# Patient Record
Sex: Female | Born: 1945 | Race: White | Hispanic: No | Marital: Married | Smoking: Current every day smoker
Health system: Southern US, Community
[De-identification: ages and names within clinical notes are randomized; demographics above are authoritative.]

## PROBLEM LIST (undated history)

## (undated) DIAGNOSIS — R110 Nausea: Secondary | ICD-10-CM

## (undated) DIAGNOSIS — M797 Fibromyalgia: Secondary | ICD-10-CM

## (undated) DIAGNOSIS — I10 Essential (primary) hypertension: Secondary | ICD-10-CM

## (undated) DIAGNOSIS — F419 Anxiety disorder, unspecified: Secondary | ICD-10-CM

## (undated) DIAGNOSIS — T8859XA Other complications of anesthesia, initial encounter: Secondary | ICD-10-CM

## (undated) DIAGNOSIS — M1990 Unspecified osteoarthritis, unspecified site: Secondary | ICD-10-CM

## (undated) DIAGNOSIS — J45909 Unspecified asthma, uncomplicated: Secondary | ICD-10-CM

## (undated) DIAGNOSIS — F329 Major depressive disorder, single episode, unspecified: Secondary | ICD-10-CM

## (undated) DIAGNOSIS — F32A Depression, unspecified: Secondary | ICD-10-CM

## (undated) DIAGNOSIS — R002 Palpitations: Secondary | ICD-10-CM

## (undated) DIAGNOSIS — E785 Hyperlipidemia, unspecified: Secondary | ICD-10-CM

## (undated) DIAGNOSIS — M51369 Other intervertebral disc degeneration, lumbar region without mention of lumbar back pain or lower extremity pain: Secondary | ICD-10-CM

## (undated) DIAGNOSIS — Z9289 Personal history of other medical treatment: Secondary | ICD-10-CM

## (undated) DIAGNOSIS — K2210 Ulcer of esophagus without bleeding: Secondary | ICD-10-CM

## (undated) DIAGNOSIS — R079 Chest pain, unspecified: Secondary | ICD-10-CM

## (undated) DIAGNOSIS — Z8719 Personal history of other diseases of the digestive system: Secondary | ICD-10-CM

## (undated) DIAGNOSIS — M6530 Trigger finger, unspecified finger: Secondary | ICD-10-CM

## (undated) DIAGNOSIS — I82409 Acute embolism and thrombosis of unspecified deep veins of unspecified lower extremity: Secondary | ICD-10-CM

## (undated) DIAGNOSIS — J449 Chronic obstructive pulmonary disease, unspecified: Secondary | ICD-10-CM

## (undated) DIAGNOSIS — M5136 Other intervertebral disc degeneration, lumbar region: Secondary | ICD-10-CM

## (undated) DIAGNOSIS — T4145XA Adverse effect of unspecified anesthetic, initial encounter: Secondary | ICD-10-CM

## (undated) DIAGNOSIS — K219 Gastro-esophageal reflux disease without esophagitis: Secondary | ICD-10-CM

## (undated) HISTORY — PX: COLOSTOMY REVERSAL: SHX5782

## (undated) HISTORY — PX: CARDIOVASCULAR STRESS TEST: SHX262

## (undated) HISTORY — PX: COLONOSCOPY: SHX174

## (undated) HISTORY — PX: BACK SURGERY: SHX140

## (undated) HISTORY — PX: INCISIONAL HERNIA REPAIR: SHX193

## (undated) HISTORY — PX: COLON RESECTION: SHX5231

## (undated) HISTORY — PX: ABDOMINAL HYSTERECTOMY: SHX81

## (undated) HISTORY — PX: COLOSTOMY: SHX63

## (undated) HISTORY — PX: TRANSTHORACIC ECHOCARDIOGRAM: SHX275

## (undated) HISTORY — PX: UPPER GI ENDOSCOPY: SHX6162

---

## 2015-10-27 ENCOUNTER — Other Ambulatory Visit: Payer: Self-pay | Admitting: Neurosurgery

## 2015-10-27 DIAGNOSIS — M4150 Other secondary scoliosis, site unspecified: Principal | ICD-10-CM

## 2015-11-16 ENCOUNTER — Ambulatory Visit
Admission: RE | Admit: 2015-11-16 | Discharge: 2015-11-16 | Disposition: A | Discharge status: Home / Self Care | Payer: Medicare Other | Source: Ambulatory Visit | Attending: Neurosurgery | Admitting: Neurosurgery

## 2015-11-16 DIAGNOSIS — M4150 Other secondary scoliosis, site unspecified: Principal | ICD-10-CM

## 2015-11-16 MED ORDER — DIAZEPAM 5 MG PO TABS
5.0000 mg | ORAL_TABLET | Freq: Once | ORAL | Status: AC
  Administered 2015-11-16: 5 mg via ORAL

## 2015-11-16 MED ORDER — ONDANSETRON HCL 4 MG/2ML IJ SOLN: 4.0000 mg | Freq: Four times a day (QID) | INTRAMUSCULAR | Status: DC | PRN

## 2015-11-16 MED ORDER — IOPAMIDOL (ISOVUE-M 200) INJECTION 41%
15.0000 mL | Freq: Once | INTRAMUSCULAR | Status: AC
  Administered 2015-11-16: 15 mL via INTRATHECAL

## 2015-11-16 NOTE — Discharge Instructions (Signed)
Myelogram Discharge Instructions  1. Go home and rest quietly for the next 24 hours.  It is important to lie flat for the next 24 hours.  Get up only to go to the restroom.  You may lie in the bed or on a couch on your back, your stomach, your left side or your right side.  You may have one pillow under your head.  You may have pillows between your knees while you are on your side or under your knees while you are on your back.  2. DO NOT drive today.  Recline the seat as far back as it will go, while still wearing your seat belt, on the way home.  3. You may get up to go to the bathroom as needed.  You may sit up for 10 minutes to eat.  You may resume your normal diet and medications unless otherwise indicated.  Drink lots of extra fluids today and tomorrow.  4. The incidence of headache, nausea, or vomiting is about 5% (one in 20 patients).  If you develop a headache, lie flat and drink plenty of fluids until the headache goes away.  Caffeinated beverages may be helpful.  If you develop severe nausea and vomiting or a headache that does not go away with flat bed rest, call [phone].  5. You may resume normal activities after your 24 hours of bed rest is over; however, do not exert yourself strongly or do any heavy lifting tomorrow. If when you get up you have a headache when standing, go back to bed and force fluids for another 24 hours.  6. Call your physician for a follow-up appointment.  The results of your myelogram will be sent directly to your physician by the following day.  7. If you have any questions or if complications develop after you arrive home, please call [phone].  Discharge instructions have been explained to the patient.  The patient, or the person responsible for the patient, fully understands these instructions.        May resume Cymbalta and Phenergan on Nov. 3, 2017, after 10:30 am.

## 2015-11-16 NOTE — Progress Notes (Signed)
Patient states she has been off Cymbalta and Phenergan for at least the past two days.  jkl

## 2015-11-20 ENCOUNTER — Telehealth: Payer: Self-pay

## 2015-11-20 NOTE — Telephone Encounter (Signed)
Spoke with patient after she had a myelogram here 11/16/15.  She states she never even felt the needle or the consrast go in and that she is doing fine.  Denies headache.  Requests report be faxed to Dr. Samule Ohmoger McMurray in [place], [place], as she has an appointment with him this afternoon [phone]). jkl

## 2015-11-29 ENCOUNTER — Other Ambulatory Visit: Payer: Self-pay | Admitting: Neurosurgery

## 2015-12-18 ENCOUNTER — Other Ambulatory Visit: Payer: Self-pay | Admitting: Neurosurgery

## 2015-12-21 ENCOUNTER — Other Ambulatory Visit: Payer: Self-pay | Admitting: Neurosurgery

## 2015-12-21 ENCOUNTER — Encounter (HOSPITAL_COMMUNITY)
Admission: RE | Admit: 2015-12-21 | Discharge: 2015-12-21 | Disposition: A | Discharge status: Home / Self Care | Payer: Medicare Other | Source: Ambulatory Visit | Attending: Neurosurgery | Admitting: Neurosurgery

## 2015-12-21 ENCOUNTER — Encounter (HOSPITAL_COMMUNITY): Payer: Self-pay

## 2015-12-21 DIAGNOSIS — Z01812 Encounter for preprocedural laboratory examination: Secondary | ICD-10-CM | POA: Insufficient documentation

## 2015-12-21 DIAGNOSIS — Z86718 Personal history of other venous thrombosis and embolism: Secondary | ICD-10-CM | POA: Insufficient documentation

## 2015-12-21 DIAGNOSIS — I10 Essential (primary) hypertension: Secondary | ICD-10-CM | POA: Diagnosis not present

## 2015-12-21 DIAGNOSIS — Z79899 Other long term (current) drug therapy: Secondary | ICD-10-CM | POA: Diagnosis not present

## 2015-12-21 DIAGNOSIS — Z0183 Encounter for blood typing: Secondary | ICD-10-CM | POA: Insufficient documentation

## 2015-12-21 DIAGNOSIS — F329 Major depressive disorder, single episode, unspecified: Secondary | ICD-10-CM | POA: Diagnosis not present

## 2015-12-21 DIAGNOSIS — J449 Chronic obstructive pulmonary disease, unspecified: Secondary | ICD-10-CM | POA: Diagnosis not present

## 2015-12-21 DIAGNOSIS — M797 Fibromyalgia: Secondary | ICD-10-CM | POA: Diagnosis not present

## 2015-12-21 DIAGNOSIS — F419 Anxiety disorder, unspecified: Secondary | ICD-10-CM | POA: Diagnosis not present

## 2015-12-21 DIAGNOSIS — M4180 Other forms of scoliosis, site unspecified: Secondary | ICD-10-CM | POA: Diagnosis not present

## 2015-12-21 DIAGNOSIS — Z01818 Encounter for other preprocedural examination: Secondary | ICD-10-CM | POA: Diagnosis present

## 2015-12-21 DIAGNOSIS — Z7951 Long term (current) use of inhaled steroids: Secondary | ICD-10-CM | POA: Diagnosis not present

## 2015-12-21 DIAGNOSIS — E785 Hyperlipidemia, unspecified: Secondary | ICD-10-CM | POA: Diagnosis not present

## 2015-12-21 DIAGNOSIS — K219 Gastro-esophageal reflux disease without esophagitis: Secondary | ICD-10-CM | POA: Diagnosis not present

## 2015-12-21 HISTORY — DX: Trigger finger, unspecified finger: M65.30

## 2015-12-21 HISTORY — DX: Nausea: R11.0

## 2015-12-21 HISTORY — DX: Other complications of anesthesia, initial encounter: T88.59XA

## 2015-12-21 HISTORY — DX: Unspecified osteoarthritis, unspecified site: M19.90

## 2015-12-21 HISTORY — DX: Essential (primary) hypertension: I10

## 2015-12-21 HISTORY — DX: Anxiety disorder, unspecified: F41.9

## 2015-12-21 HISTORY — DX: Fibromyalgia: M79.7

## 2015-12-21 HISTORY — DX: Gastro-esophageal reflux disease without esophagitis: K21.9

## 2015-12-21 HISTORY — DX: Palpitations: R00.2

## 2015-12-21 HISTORY — DX: Ulcer of esophagus without bleeding: K22.10

## 2015-12-21 HISTORY — DX: Chronic obstructive pulmonary disease, unspecified: J44.9

## 2015-12-21 HISTORY — DX: Unspecified asthma, uncomplicated: J45.909

## 2015-12-21 HISTORY — DX: Hyperlipidemia, unspecified: E78.5

## 2015-12-21 HISTORY — DX: Other intervertebral disc degeneration, lumbar region without mention of lumbar back pain or lower extremity pain: M51.369

## 2015-12-21 HISTORY — DX: Personal history of other diseases of the digestive system: Z87.19

## 2015-12-21 HISTORY — DX: Depression, unspecified: F32.A

## 2015-12-21 HISTORY — DX: Acute embolism and thrombosis of unspecified deep veins of unspecified lower extremity: I82.409

## 2015-12-21 HISTORY — DX: Adverse effect of unspecified anesthetic, initial encounter: T41.45XA

## 2015-12-21 HISTORY — DX: Personal history of other medical treatment: Z92.89

## 2015-12-21 HISTORY — DX: Other intervertebral disc degeneration, lumbar region: M51.36

## 2015-12-21 HISTORY — DX: Chest pain, unspecified: R07.9

## 2015-12-21 HISTORY — DX: Major depressive disorder, single episode, unspecified: F32.9

## 2015-12-21 LAB — CBC
HCT: 37.5 % (ref 36.0–46.0)
HEMOGLOBIN: 12.3 g/dL (ref 12.0–15.0)
MCH: 29.9 pg (ref 26.0–34.0)
MCHC: 32.8 g/dL (ref 30.0–36.0)
MCV: 91 fL (ref 78.0–100.0)
Platelets: 288 10*3/uL (ref 150–400)
RBC: 4.12 MIL/uL (ref 3.87–5.11)
RDW: 14.3 % (ref 11.5–15.5)
WBC: 6.5 10*3/uL (ref 4.0–10.5)

## 2015-12-21 LAB — TYPE AND SCREEN
ABO/RH(D): A POS
Antibody Screen: NEGATIVE

## 2015-12-21 LAB — BASIC METABOLIC PANEL
Anion gap: 9 (ref 5–15)
BUN: 13 mg/dL (ref 6–20)
CHLORIDE: 101 mmol/L (ref 101–111)
CO2: 27 mmol/L (ref 22–32)
Calcium: 9.6 mg/dL (ref 8.9–10.3)
Creatinine, Ser: 0.96 mg/dL (ref 0.44–1.00)
GFR calc Af Amer: >60 mL/min (ref >60)
GFR calc non Af Amer: 59 mL/min — ABNORMAL LOW (ref >60)
GLUCOSE: 104 mg/dL — AB (ref 65–99)
POTASSIUM: 4.1 mmol/L (ref 3.5–5.1)
Sodium: 137 mmol/L (ref 135–145)

## 2015-12-21 LAB — SURGICAL PCR SCREEN
MRSA, PCR: NEGATIVE
Staphylococcus aureus: NEGATIVE

## 2015-12-21 LAB — ABO/RH: ABO/RH(D): A POS

## 2015-12-21 NOTE — Pre-Procedure Instructions (Signed)
    Carla Lester  12/21/2015    Your procedure is scheduled on Monday, September 11.  Report to [facility] Admitting at 8:30 AM                  Your surgery or procedure is scheduled for 10:30    Call this number if you have problems the morning of surgery: [phone]                 For any other questions, please call [phone], Monday - Friday 8 AM - 4 PM.   Remember:  Do not eat food or drink liquids after midnight, Sunday, December 10.  Take these medicines the morning of surgery with A SIP OF WATER : DULoxetine (CYMBALTA), estradiol (ESTRACE), gabapentin (NEURONTIN), methocarbamol (ROBAXIN), omeprazole (PRILOSEC).  Use   Fluticasone-Salmeterol (ADVAIR).       Take if needed: ALPRAZolam Prudy Feeler(XANAX), cetirizine (ZYRTEC).  May use Albuterol; and please bring it to the hospital with you.              `1 Week prior to surgery STOP taking Aspiri, Aspirin Products (Goody Powder, Excedrin Migraine), Ibuprofen (Advil), Naproxen (Aleve), Viatiams and Herbal Products (ie Fish Oil)   Do not wear jewelry, make-up or nail polish.  Do not wear lotions, powders, or perfumes, or deodorant.  Do not shave 48 hours prior to surgery.    Do not bring valuables to the hospital.  [facility] is not responsible for any belongings or valuables.  Contacts, dentures or bridgework may not be worn into surgery.  Leave your suitcase in the car.  After surgery it may be brought to your room.  For patients admitted to the hospital, discharge time will be determined by your treatment team.  Special instructions: Review  [place] - Preparing For Surgery.  Please read over the following fact sheets that you were given. - Preparing For Surgery and Coughing and Deep Breathing andPain Booklet

## 2015-12-21 NOTE — Progress Notes (Signed)
Mrs Carla Lester is in [place] for [place]. [place] [facility] [place] Dr Lovell SheehanJenkins did a previous back surgery on patient. Mrs Carla Lester has a history of HTN and palpations and chest pain.  Patient's cardiologist is Dr Anette Guarneriebra Doud.  [name] reports that she had a stress test last year, in December. Patients states that she sees cardiologist on a yearly basis.  [name] brought an EKG that was done 10/24/15.  [name] reports that she suffers for palpations and left sided chest pain that radiates to her left arm. Patient states that pain last approximately 5 minutes and she is ocassionally short of breath and diaphoretic when she experiences it."It is all anxiety driven and is relieved with xanax."  Records requested from Dr Lesle Chrisoub.

## 2015-12-22 ENCOUNTER — Encounter (HOSPITAL_COMMUNITY): Payer: Self-pay

## 2015-12-22 NOTE — Progress Notes (Addendum)
Error. See other note.  Velna Ochsllison Zelenak, PA-C [facility]/Anesthesiology Phone [phone] 12/25/2015 9:45 AM

## 2015-12-25 ENCOUNTER — Encounter (HOSPITAL_COMMUNITY): Payer: Self-pay

## 2015-12-25 ENCOUNTER — Encounter (HOSPITAL_COMMUNITY): Payer: Self-pay | Admitting: *Deleted

## 2015-12-25 ENCOUNTER — Inpatient Hospital Stay (HOSPITAL_COMMUNITY): Payer: Medicare Other | Admitting: Vascular Surgery

## 2015-12-25 ENCOUNTER — Inpatient Hospital Stay (HOSPITAL_COMMUNITY): Payer: Medicare Other

## 2015-12-25 ENCOUNTER — Encounter (HOSPITAL_COMMUNITY)
Admission: RE | Disposition: A | Discharge status: Home / Self Care | Payer: Self-pay | Source: Ambulatory Visit | Attending: Neurosurgery

## 2015-12-25 ENCOUNTER — Inpatient Hospital Stay (HOSPITAL_COMMUNITY)
Admission: RE | Admit: 2015-12-25 | Discharge: 2015-12-29 | DRG: 454 | Disposition: A | Discharge status: Home / Self Care | Payer: Medicare Other | Source: Ambulatory Visit | Attending: Neurosurgery | Admitting: Neurosurgery

## 2015-12-25 ENCOUNTER — Encounter (HOSPITAL_COMMUNITY): Payer: Self-pay | Admitting: Anesthesiology

## 2015-12-25 DIAGNOSIS — K529 Noninfective gastroenteritis and colitis, unspecified: Secondary | ICD-10-CM | POA: Diagnosis present

## 2015-12-25 DIAGNOSIS — Z86718 Personal history of other venous thrombosis and embolism: Secondary | ICD-10-CM

## 2015-12-25 DIAGNOSIS — Z882 Allergy status to sulfonamides status: Secondary | ICD-10-CM

## 2015-12-25 DIAGNOSIS — Z888 Allergy status to other drugs, medicaments and biological substances status: Secondary | ICD-10-CM

## 2015-12-25 DIAGNOSIS — M797 Fibromyalgia: Secondary | ICD-10-CM | POA: Diagnosis present

## 2015-12-25 DIAGNOSIS — M419 Scoliosis, unspecified: Secondary | ICD-10-CM | POA: Diagnosis present

## 2015-12-25 DIAGNOSIS — Z981 Arthrodesis status: Secondary | ICD-10-CM | POA: Diagnosis not present

## 2015-12-25 DIAGNOSIS — Z881 Allergy status to other antibiotic agents status: Secondary | ICD-10-CM | POA: Diagnosis not present

## 2015-12-25 DIAGNOSIS — Z79899 Other long term (current) drug therapy: Secondary | ICD-10-CM

## 2015-12-25 DIAGNOSIS — F17200 Nicotine dependence, unspecified, uncomplicated: Secondary | ICD-10-CM | POA: Diagnosis present

## 2015-12-25 DIAGNOSIS — F419 Anxiety disorder, unspecified: Secondary | ICD-10-CM | POA: Diagnosis present

## 2015-12-25 DIAGNOSIS — F329 Major depressive disorder, single episode, unspecified: Secondary | ICD-10-CM | POA: Diagnosis present

## 2015-12-25 DIAGNOSIS — M4690 Unspecified inflammatory spondylopathy, site unspecified: Secondary | ICD-10-CM | POA: Diagnosis present

## 2015-12-25 DIAGNOSIS — G8929 Other chronic pain: Secondary | ICD-10-CM | POA: Diagnosis present

## 2015-12-25 DIAGNOSIS — M5116 Intervertebral disc disorders with radiculopathy, lumbar region: Secondary | ICD-10-CM | POA: Diagnosis present

## 2015-12-25 DIAGNOSIS — D62 Acute posthemorrhagic anemia: Secondary | ICD-10-CM | POA: Diagnosis present

## 2015-12-25 DIAGNOSIS — K219 Gastro-esophageal reflux disease without esophagitis: Secondary | ICD-10-CM | POA: Diagnosis present

## 2015-12-25 DIAGNOSIS — Z885 Allergy status to narcotic agent status: Secondary | ICD-10-CM | POA: Diagnosis not present

## 2015-12-25 DIAGNOSIS — R109 Unspecified abdominal pain: Secondary | ICD-10-CM | POA: Diagnosis present

## 2015-12-25 DIAGNOSIS — K2270 Barrett's esophagus without dysplasia: Secondary | ICD-10-CM | POA: Diagnosis present

## 2015-12-25 DIAGNOSIS — I10 Essential (primary) hypertension: Secondary | ICD-10-CM | POA: Diagnosis present

## 2015-12-25 DIAGNOSIS — M48061 Spinal stenosis, lumbar region without neurogenic claudication: Secondary | ICD-10-CM | POA: Diagnosis present

## 2015-12-25 DIAGNOSIS — J449 Chronic obstructive pulmonary disease, unspecified: Secondary | ICD-10-CM | POA: Diagnosis present

## 2015-12-25 DIAGNOSIS — Z79891 Long term (current) use of opiate analgesic: Secondary | ICD-10-CM

## 2015-12-25 DIAGNOSIS — Z419 Encounter for procedure for purposes other than remedying health state, unspecified: Secondary | ICD-10-CM

## 2015-12-25 DIAGNOSIS — E785 Hyperlipidemia, unspecified: Secondary | ICD-10-CM | POA: Diagnosis present

## 2015-12-25 DIAGNOSIS — Z880 Allergy status to penicillin: Secondary | ICD-10-CM

## 2015-12-25 DIAGNOSIS — M4807 Spinal stenosis, lumbosacral region: Secondary | ICD-10-CM | POA: Diagnosis present

## 2015-12-25 DIAGNOSIS — R52 Pain, unspecified: Secondary | ICD-10-CM

## 2015-12-25 HISTORY — PX: POSTERIOR LUMBAR FUSION 4 LEVEL: SHX6037

## 2015-12-25 SURGERY — POSTERIOR LUMBAR FUSION 4 LEVEL
Anesthesia: General | Site: Spine Lumbar

## 2015-12-25 MED ORDER — PROPOFOL 10 MG/ML IV BOLUS
INTRAVENOUS | Status: AC
  Filled 2015-12-25: qty 20

## 2015-12-25 MED ORDER — ROCURONIUM BROMIDE 50 MG/5ML IV SOSY
PREFILLED_SYRINGE | INTRAVENOUS | Status: AC
  Filled 2015-12-25: qty 5

## 2015-12-25 MED ORDER — BISACODYL 10 MG RE SUPP
10.0000 mg | Freq: Every day | RECTAL | Status: DC | PRN
  Administered 2015-12-26 – 2015-12-27 (×2): 10 mg via RECTAL
  Filled 2015-12-25 (×2): qty 1

## 2015-12-25 MED ORDER — MOMETASONE FURO-FORMOTEROL FUM 200-5 MCG/ACT IN AERO
2.0000 | INHALATION_SPRAY | Freq: Two times a day (BID) | RESPIRATORY_TRACT | Status: DC
  Administered 2015-12-27 – 2015-12-29 (×5): 2 via RESPIRATORY_TRACT
  Filled 2015-12-25: qty 8.8

## 2015-12-25 MED ORDER — LACTATED RINGERS IV SOLN
INTRAVENOUS | Status: DC
  Administered 2015-12-25 – 2015-12-26 (×3): via INTRAVENOUS

## 2015-12-25 MED ORDER — OXYCODONE HCL 5 MG PO TABS: 5.0000 mg | ORAL_TABLET | Freq: Once | ORAL | Status: DC | PRN

## 2015-12-25 MED ORDER — PHENYLEPHRINE HCL 10 MG/ML IJ SOLN
INTRAMUSCULAR | Status: DC | PRN
  Administered 2015-12-25 (×3): 40 ug via INTRAVENOUS

## 2015-12-25 MED ORDER — ACETAMINOPHEN 325 MG PO TABS: 650.0000 mg | ORAL_TABLET | ORAL | Status: DC | PRN

## 2015-12-25 MED ORDER — HYDROCODONE-ACETAMINOPHEN 5-325 MG PO TABS
1.0000 | ORAL_TABLET | ORAL | Status: DC | PRN
  Administered 2015-12-26 – 2015-12-29 (×3): 1 via ORAL
  Filled 2015-12-25 (×3): qty 1

## 2015-12-25 MED ORDER — MIDAZOLAM HCL 5 MG/5ML IJ SOLN
INTRAMUSCULAR | Status: DC | PRN
  Administered 2015-12-25: 2 mg via INTRAVENOUS

## 2015-12-25 MED ORDER — SURGIFOAM 100 EX MISC
CUTANEOUS | Status: DC | PRN
  Administered 2015-12-25 (×3): 20 mL via TOPICAL

## 2015-12-25 MED ORDER — BACITRACIN ZINC 500 UNIT/GM EX OINT
TOPICAL_OINTMENT | CUTANEOUS | Status: DC | PRN
  Administered 2015-12-25: 1 via TOPICAL

## 2015-12-25 MED ORDER — THROMBIN 5000 UNITS EX SOLR
CUTANEOUS | Status: AC
  Filled 2015-12-25: qty 5000

## 2015-12-25 MED ORDER — GABAPENTIN 300 MG PO CAPS
300.0000 mg | ORAL_CAPSULE | Freq: Three times a day (TID) | ORAL | Status: DC
  Administered 2015-12-25 – 2015-12-29 (×11): 300 mg via ORAL
  Filled 2015-12-25 (×11): qty 1

## 2015-12-25 MED ORDER — DOCUSATE SODIUM 100 MG PO CAPS
100.0000 mg | ORAL_CAPSULE | Freq: Two times a day (BID) | ORAL | Status: DC
  Administered 2015-12-25 – 2015-12-29 (×8): 100 mg via ORAL
  Filled 2015-12-25 (×8): qty 1

## 2015-12-25 MED ORDER — ALBUMIN HUMAN 5 % IV SOLN
INTRAVENOUS | Status: DC | PRN
  Administered 2015-12-25 (×2): via INTRAVENOUS

## 2015-12-25 MED ORDER — PANTOPRAZOLE SODIUM 40 MG PO TBEC
80.0000 mg | DELAYED_RELEASE_TABLET | Freq: Every day | ORAL | Status: DC
  Administered 2015-12-26 – 2015-12-29 (×4): 80 mg via ORAL
  Filled 2015-12-25 (×4): qty 2

## 2015-12-25 MED ORDER — DEXAMETHASONE SODIUM PHOSPHATE 10 MG/ML IJ SOLN
INTRAMUSCULAR | Status: AC
  Filled 2015-12-25: qty 1

## 2015-12-25 MED ORDER — VANCOMYCIN HCL 1000 MG IV SOLR
INTRAVENOUS | Status: DC | PRN
  Administered 2015-12-25: 1000 mg via TOPICAL

## 2015-12-25 MED ORDER — PHENOL 1.4 % MT LIQD: 1.0000 | OROMUCOSAL | Status: DC | PRN

## 2015-12-25 MED ORDER — CHLORHEXIDINE GLUCONATE CLOTH 2 % EX PADS: 6.0000 | MEDICATED_PAD | Freq: Once | CUTANEOUS | Status: DC

## 2015-12-25 MED ORDER — PROPOFOL 10 MG/ML IV BOLUS
INTRAVENOUS | Status: DC | PRN
  Administered 2015-12-25: 170 mg via INTRAVENOUS

## 2015-12-25 MED ORDER — OXYCODONE HCL 5 MG/5ML PO SOLN: 5.0000 mg | Freq: Once | ORAL | Status: DC | PRN

## 2015-12-25 MED ORDER — SODIUM CHLORIDE 0.9 % IR SOLN
Status: DC | PRN
  Administered 2015-12-25: 500 mL

## 2015-12-25 MED ORDER — PROMETHAZINE HCL 25 MG PO TABS
25.0000 mg | ORAL_TABLET | Freq: Four times a day (QID) | ORAL | Status: DC | PRN
Start: 2015-12-25 — End: 2015-12-25

## 2015-12-25 MED ORDER — DEXMEDETOMIDINE HCL IN NACL 200 MCG/50ML IV SOLN
INTRAVENOUS | Status: DC | PRN
  Administered 2015-12-25: 16:00:00 via INTRAVENOUS
  Administered 2015-12-25: .5 ug/kg/h via INTRAVENOUS

## 2015-12-25 MED ORDER — VANCOMYCIN HCL 1000 MG IV SOLR
INTRAVENOUS | Status: AC
  Filled 2015-12-25: qty 1000

## 2015-12-25 MED ORDER — FENTANYL CITRATE (PF) 100 MCG/2ML IJ SOLN
INTRAMUSCULAR | Status: AC
  Filled 2015-12-25: qty 4

## 2015-12-25 MED ORDER — DEXAMETHASONE SODIUM PHOSPHATE 10 MG/ML IJ SOLN
INTRAMUSCULAR | Status: DC | PRN
  Administered 2015-12-25: 10 mg via INTRAVENOUS

## 2015-12-25 MED ORDER — HYDROMORPHONE HCL 1 MG/ML IJ SOLN: 0.2500 mg | INTRAMUSCULAR | Status: DC | PRN

## 2015-12-25 MED ORDER — KETAMINE HCL-SODIUM CHLORIDE 100-0.9 MG/10ML-% IV SOSY
PREFILLED_SYRINGE | INTRAVENOUS | Status: AC
  Filled 2015-12-25: qty 10

## 2015-12-25 MED ORDER — MENTHOL 3 MG MT LOZG: 1.0000 | LOZENGE | OROMUCOSAL | Status: DC | PRN

## 2015-12-25 MED ORDER — MORPHINE SULFATE (PF) 2 MG/ML IV SOLN
1.0000 mg | INTRAVENOUS | Status: DC | PRN
  Administered 2015-12-25 – 2015-12-26 (×3): 2 mg via INTRAVENOUS
  Administered 2015-12-26: 4 mg via INTRAVENOUS
  Administered 2015-12-26 – 2015-12-27 (×4): 2 mg via INTRAVENOUS
  Filled 2015-12-25 (×3): qty 1
  Filled 2015-12-25: qty 2
  Filled 2015-12-25 (×2): qty 1
  Filled 2015-12-25: qty 2
  Filled 2015-12-25 (×2): qty 1

## 2015-12-25 MED ORDER — LACTATED RINGERS IV SOLN
INTRAVENOUS | Status: DC | PRN
  Administered 2015-12-25: 12:00:00 via INTRAVENOUS

## 2015-12-25 MED ORDER — HEMOSTATIC AGENTS (NO CHARGE) OPTIME
TOPICAL | Status: DC | PRN
  Administered 2015-12-25: 1 via TOPICAL

## 2015-12-25 MED ORDER — THROMBIN 20000 UNITS EX SOLR
CUTANEOUS | Status: AC
  Filled 2015-12-25: qty 20000

## 2015-12-25 MED ORDER — BUPIVACAINE LIPOSOME 1.3 % IJ SUSP
20.0000 mL | INTRAMUSCULAR | Status: AC
  Administered 2015-12-25: 20 mL
  Filled 2015-12-25: qty 20

## 2015-12-25 MED ORDER — ESTRADIOL 1 MG PO TABS
0.5000 mg | ORAL_TABLET | Freq: Every day | ORAL | Status: DC
  Administered 2015-12-26 – 2015-12-29 (×4): 0.5 mg via ORAL
  Filled 2015-12-25 (×4): qty 0.5

## 2015-12-25 MED ORDER — DEXMEDETOMIDINE HCL IN NACL 200 MCG/50ML IV SOLN
INTRAVENOUS | Status: AC
  Filled 2015-12-25: qty 50

## 2015-12-25 MED ORDER — ROCURONIUM BROMIDE 100 MG/10ML IV SOLN
INTRAVENOUS | Status: DC | PRN
  Administered 2015-12-25: 10 mg via INTRAVENOUS
  Administered 2015-12-25 (×3): 20 mg via INTRAVENOUS
  Administered 2015-12-25: 30 mg via INTRAVENOUS
  Administered 2015-12-25 (×2): 20 mg via INTRAVENOUS
  Administered 2015-12-25: 50 mg via INTRAVENOUS

## 2015-12-25 MED ORDER — ADULT MULTIVITAMIN W/MINERALS CH
1.0000 | ORAL_TABLET | Freq: Every day | ORAL | Status: DC
  Administered 2015-12-26 – 2015-12-29 (×4): 1 via ORAL
  Filled 2015-12-25 (×4): qty 1

## 2015-12-25 MED ORDER — 0.9 % SODIUM CHLORIDE (POUR BTL) OPTIME
TOPICAL | Status: DC | PRN
Start: 2015-12-25 — End: 2015-12-25
  Administered 2015-12-25: 1000 mL

## 2015-12-25 MED ORDER — DEXTROSE 5 % IV SOLN
INTRAVENOUS | Status: DC | PRN
  Administered 2015-12-25: 25 ug/min via INTRAVENOUS

## 2015-12-25 MED ORDER — ACETAMINOPHEN 650 MG RE SUPP: 650.0000 mg | RECTAL | Status: DC | PRN

## 2015-12-25 MED ORDER — DULOXETINE HCL 60 MG PO CPEP
60.0000 mg | ORAL_CAPSULE | Freq: Two times a day (BID) | ORAL | Status: DC
  Administered 2015-12-25 – 2015-12-29 (×8): 60 mg via ORAL
  Filled 2015-12-25 (×8): qty 1

## 2015-12-25 MED ORDER — OXYCODONE-ACETAMINOPHEN 5-325 MG PO TABS
1.0000 | ORAL_TABLET | ORAL | Status: DC | PRN
  Administered 2015-12-26: 2 via ORAL
  Administered 2015-12-26: 1 via ORAL
  Administered 2015-12-26 – 2015-12-29 (×9): 2 via ORAL
  Filled 2015-12-25 (×3): qty 2
  Filled 2015-12-25: qty 1
  Filled 2015-12-25 (×9): qty 2

## 2015-12-25 MED ORDER — VANCOMYCIN HCL IN DEXTROSE 1-5 GM/200ML-% IV SOLN
1000.0000 mg | INTRAVENOUS | Status: AC
  Administered 2015-12-25: 1000 mg via INTRAVENOUS
  Filled 2015-12-25: qty 200

## 2015-12-25 MED ORDER — LORATADINE 10 MG PO TABS
10.0000 mg | ORAL_TABLET | Freq: Every day | ORAL | Status: DC
  Administered 2015-12-25 – 2015-12-29 (×5): 10 mg via ORAL
  Filled 2015-12-25 (×4): qty 1

## 2015-12-25 MED ORDER — BACITRACIN ZINC 500 UNIT/GM EX OINT
TOPICAL_OINTMENT | CUTANEOUS | Status: AC
  Filled 2015-12-25: qty 28.35

## 2015-12-25 MED ORDER — LACTATED RINGERS IV SOLN
INTRAVENOUS | Status: DC
  Administered 2015-12-25 (×3): via INTRAVENOUS

## 2015-12-25 MED ORDER — LIDOCAINE-EPINEPHRINE (PF) 2 %-1:200000 IJ SOLN
INTRAMUSCULAR | Status: DC | PRN
  Administered 2015-12-25: 10 mL

## 2015-12-25 MED ORDER — DEXMEDETOMIDINE HCL 200 MCG/2ML IV SOLN
INTRAVENOUS | Status: DC | PRN
  Administered 2015-12-25: 36.4 ug via INTRAVENOUS

## 2015-12-25 MED ORDER — SUCRALFATE 1 G PO TABS
1.0000 g | ORAL_TABLET | Freq: Three times a day (TID) | ORAL | Status: DC
  Administered 2015-12-25 – 2015-12-29 (×13): 1 g via ORAL
  Filled 2015-12-25 (×13): qty 1

## 2015-12-25 MED ORDER — SUGAMMADEX SODIUM 200 MG/2ML IV SOLN
INTRAVENOUS | Status: DC | PRN
  Administered 2015-12-25: 200 mg via INTRAVENOUS

## 2015-12-25 MED ORDER — VANCOMYCIN HCL 500 MG IV SOLR
500.0000 mg | Freq: Once | INTRAVENOUS | Status: AC
  Administered 2015-12-25: 500 mg via INTRAVENOUS
  Filled 2015-12-25: qty 500

## 2015-12-25 MED ORDER — ONDANSETRON HCL 4 MG/2ML IJ SOLN: 4.0000 mg | Freq: Once | INTRAMUSCULAR | Status: DC | PRN

## 2015-12-25 MED ORDER — DEXMEDETOMIDINE HCL IN NACL 200 MCG/50ML IV SOLN
INTRAVENOUS | Status: AC
Start: 2015-12-25 — End: 2015-12-25
  Filled 2015-12-25: qty 50

## 2015-12-25 MED ORDER — HYDROCHLOROTHIAZIDE 25 MG PO TABS: 12.5000 mg | ORAL_TABLET | Freq: Every day | ORAL | Status: DC | PRN

## 2015-12-25 MED ORDER — ONDANSETRON HCL 4 MG/2ML IJ SOLN
4.0000 mg | INTRAMUSCULAR | Status: DC | PRN
  Administered 2015-12-26 – 2015-12-27 (×5): 4 mg via INTRAVENOUS
  Filled 2015-12-25 (×5): qty 2

## 2015-12-25 MED ORDER — FENTANYL CITRATE (PF) 100 MCG/2ML IJ SOLN
INTRAMUSCULAR | Status: AC
  Filled 2015-12-25: qty 2

## 2015-12-25 MED ORDER — THROMBIN 5000 UNITS EX SOLR
OROMUCOSAL | Status: DC | PRN
  Administered 2015-12-25: 5 mL via TOPICAL

## 2015-12-25 MED ORDER — LIDOCAINE 2% (20 MG/ML) 5 ML SYRINGE
INTRAMUSCULAR | Status: AC
  Filled 2015-12-25: qty 5

## 2015-12-25 MED ORDER — FENTANYL CITRATE (PF) 100 MCG/2ML IJ SOLN
INTRAMUSCULAR | Status: DC | PRN
  Administered 2015-12-25 (×6): 50 ug via INTRAVENOUS
  Administered 2015-12-25 (×2): 100 ug via INTRAVENOUS

## 2015-12-25 MED ORDER — ALPRAZOLAM 0.5 MG PO TABS
0.5000 mg | ORAL_TABLET | Freq: Three times a day (TID) | ORAL | Status: DC | PRN
  Administered 2015-12-26 – 2015-12-29 (×6): 0.5 mg via ORAL
  Filled 2015-12-25 (×6): qty 1

## 2015-12-25 MED ORDER — ACETAMINOPHEN 10 MG/ML IV SOLN
1000.0000 mg | Freq: Once | INTRAVENOUS | Status: AC
Start: 2015-12-25 — End: 2015-12-25
  Administered 2015-12-25: 1000 mg via INTRAVENOUS
  Filled 2015-12-25: qty 100

## 2015-12-25 MED ORDER — LIDOCAINE-EPINEPHRINE (PF) 2 %-1:200000 IJ SOLN
INTRAMUSCULAR | Status: AC
  Filled 2015-12-25: qty 20

## 2015-12-25 MED ORDER — PHENYLEPHRINE 40 MCG/ML (10ML) SYRINGE FOR IV PUSH (FOR BLOOD PRESSURE SUPPORT)
PREFILLED_SYRINGE | INTRAVENOUS | Status: AC
  Filled 2015-12-25: qty 10

## 2015-12-25 MED ORDER — ONDANSETRON HCL 4 MG/2ML IJ SOLN
INTRAMUSCULAR | Status: DC | PRN
  Administered 2015-12-25: 4 mg via INTRAVENOUS

## 2015-12-25 MED ORDER — ATORVASTATIN CALCIUM 10 MG PO TABS
20.0000 mg | ORAL_TABLET | Freq: Every day | ORAL | Status: DC
  Administered 2015-12-26 – 2015-12-28 (×3): 20 mg via ORAL
  Filled 2015-12-25 (×3): qty 2

## 2015-12-25 MED ORDER — MIDAZOLAM HCL 2 MG/2ML IJ SOLN
INTRAMUSCULAR | Status: AC
  Filled 2015-12-25: qty 2

## 2015-12-25 MED ORDER — LOSARTAN POTASSIUM 50 MG PO TABS
50.0000 mg | ORAL_TABLET | Freq: Every day | ORAL | Status: DC
  Administered 2015-12-26 – 2015-12-29 (×4): 50 mg via ORAL
  Filled 2015-12-25 (×4): qty 1

## 2015-12-25 MED ORDER — LIDOCAINE HCL (CARDIAC) 20 MG/ML IV SOLN
INTRAVENOUS | Status: DC | PRN
  Administered 2015-12-25: 60 mg via INTRAVENOUS

## 2015-12-25 MED ORDER — ALUM & MAG HYDROXIDE-SIMETH 200-200-20 MG/5ML PO SUSP
30.0000 mL | Freq: Four times a day (QID) | ORAL | Status: DC | PRN
  Administered 2015-12-27 – 2015-12-28 (×3): 30 mL via ORAL
  Filled 2015-12-25 (×3): qty 30

## 2015-12-25 MED ORDER — MONTELUKAST SODIUM 10 MG PO TABS
10.0000 mg | ORAL_TABLET | Freq: Every day | ORAL | Status: DC
  Administered 2015-12-25 – 2015-12-28 (×4): 10 mg via ORAL
  Filled 2015-12-25 (×4): qty 1

## 2015-12-25 MED ORDER — ALBUTEROL SULFATE (2.5 MG/3ML) 0.083% IN NEBU: 2.5000 mg | INHALATION_SOLUTION | Freq: Four times a day (QID) | RESPIRATORY_TRACT | Status: DC | PRN

## 2015-12-25 SURGICAL SUPPLY — 71 items
BAG DECANTER FOR FLEXI CONT (MISCELLANEOUS) ×3 IMPLANT
BENZOIN TINCTURE PRP APPL 2/3 (GAUZE/BANDAGES/DRESSINGS) ×3 IMPLANT
BUR MATCHSTICK NEURO 3.0 LAGG (BURR) ×3 IMPLANT
BUR PRECISION FLUTE 6.0 (BURR) ×3 IMPLANT
CANISTER SUCT 3000ML PPV (MISCELLANEOUS) ×3 IMPLANT
CAP REVERE LOCKING (Cap) ×36 IMPLANT
CARTRIDGE OIL MAESTRO DRILL (MISCELLANEOUS) ×1 IMPLANT
CLOSURE WOUND 1/2 X4 (GAUZE/BANDAGES/DRESSINGS) ×1
CONN CROSSLINK REV 6.35 48-60 (Connector) ×3 IMPLANT
CONNECTOR CRSLNK REV6.35 48-60 (Connector) ×1 IMPLANT
CONT SPEC 4OZ CLIKSEAL STRL BL (MISCELLANEOUS) ×3 IMPLANT
CORDS BIPOLAR (ELECTRODE) ×3 IMPLANT
COVER TABLE BACK 60X90 (DRAPES) ×3 IMPLANT
DEVICE RISE INTERBODY CREO (Neuro Prosthesis/Implant) ×4 IMPLANT
DIFFUSER DRILL AIR PNEUMATIC (MISCELLANEOUS) ×3 IMPLANT
DRAPE C-ARM 42X72 X-RAY (DRAPES) ×6 IMPLANT
DRAPE HALF SHEET 40X57 (DRAPES) ×6 IMPLANT
DRAPE LAPAROTOMY 100X72X124 (DRAPES) ×3 IMPLANT
DRAPE POUCH INSTRU U-SHP 10X18 (DRAPES) ×3 IMPLANT
DRAPE SURG 17X23 STRL (DRAPES) ×12 IMPLANT
DURASEAL APPLICATOR TIP (TIP) ×3 IMPLANT
DURASEAL SPINE SEALANT 3ML (MISCELLANEOUS) ×3 IMPLANT
ELECT BLADE 4.0 EZ CLEAN MEGAD (MISCELLANEOUS) ×3
ELECT REM PT RETURN 9FT ADLT (ELECTROSURGICAL) ×3
ELECTRODE BLDE 4.0 EZ CLN MEGD (MISCELLANEOUS) ×1 IMPLANT
ELECTRODE REM PT RTRN 9FT ADLT (ELECTROSURGICAL) ×1 IMPLANT
GAUZE SPONGE 4X4 12PLY STRL (GAUZE/BANDAGES/DRESSINGS) ×3 IMPLANT
GLOVE BIO SURGEON STRL SZ8 (GLOVE) ×6 IMPLANT
GLOVE BIO SURGEON STRL SZ8.5 (GLOVE) ×6 IMPLANT
GLOVE BIOGEL PI IND STRL 7.0 (GLOVE) ×2 IMPLANT
GLOVE BIOGEL PI INDICATOR 7.0 (GLOVE) ×4
GLOVE ECLIPSE 9.0 STRL (GLOVE) ×3 IMPLANT
GLOVE EXAM NITRILE LRG STRL (GLOVE) IMPLANT
GLOVE EXAM NITRILE XL STR (GLOVE) IMPLANT
GLOVE EXAM NITRILE XS STR PU (GLOVE) IMPLANT
GLOVE SURG SS PI 7.0 STRL IVOR (GLOVE) ×12 IMPLANT
GOWN STRL REUS W/ TWL LRG LVL3 (GOWN DISPOSABLE) ×2 IMPLANT
GOWN STRL REUS W/ TWL XL LVL3 (GOWN DISPOSABLE) ×4 IMPLANT
GOWN STRL REUS W/TWL 2XL LVL3 (GOWN DISPOSABLE) IMPLANT
GOWN STRL REUS W/TWL LRG LVL3 (GOWN DISPOSABLE) ×4
GOWN STRL REUS W/TWL XL LVL3 (GOWN DISPOSABLE) ×8
KIT BASIN OR (CUSTOM PROCEDURE TRAY) ×3 IMPLANT
KIT INFUSE MEDIUM (Orthopedic Implant) ×3 IMPLANT
KIT ROOM TURNOVER OR (KITS) ×3 IMPLANT
NEEDLE HYPO 21X1.5 SAFETY (NEEDLE) ×3 IMPLANT
NEEDLE HYPO 22GX1.5 SAFETY (NEEDLE) ×3 IMPLANT
NS IRRIG 1000ML POUR BTL (IV SOLUTION) ×3 IMPLANT
OIL CARTRIDGE MAESTRO DRILL (MISCELLANEOUS) ×3
PACK LAMINECTOMY NEURO (CUSTOM PROCEDURE TRAY) ×3 IMPLANT
PAD ARMBOARD 7.5X6 YLW CONV (MISCELLANEOUS) ×24 IMPLANT
PATTIES SURGICAL .5 X1 (DISPOSABLE) ×3 IMPLANT
PATTIES SURGICAL 1X1 (DISPOSABLE) ×6 IMPLANT
RASP 3.0MM (RASP) ×3 IMPLANT
RISE INTERBODY CREO (Neuro Prosthesis/Implant) ×12 IMPLANT
ROD SPINE HEX 6.35X200 (Rod) ×6 IMPLANT
SCREW REVERE 6.35 6.5MMX45 (Screw) ×18 IMPLANT
SCREW REVERE 6.35 6.5X40MM (Screw) ×18 IMPLANT
SPONGE GAUZE 4X4 12PLY STER LF (GAUZE/BANDAGES/DRESSINGS) ×3 IMPLANT
SPONGE SURGIFOAM ABS GEL 100 (HEMOSTASIS) ×9 IMPLANT
STRIP BIOACTIVE 20CC 25X100X8 (Miscellaneous) ×6 IMPLANT
STRIP CLOSURE SKIN 1/2X4 (GAUZE/BANDAGES/DRESSINGS) ×2 IMPLANT
SUT VIC AB 1 CT1 18XBRD ANBCTR (SUTURE) ×3 IMPLANT
SUT VIC AB 1 CT1 8-18 (SUTURE) ×6
SUT VIC AB 2-0 CP2 18 (SUTURE) ×9 IMPLANT
SYR 20CC LL (SYRINGE) ×3 IMPLANT
SYR 20ML ECCENTRIC (SYRINGE) ×3 IMPLANT
TAPE CLOTH SURG 4X10 WHT LF (GAUZE/BANDAGES/DRESSINGS) ×3 IMPLANT
TOWEL OR 17X24 6PK STRL BLUE (TOWEL DISPOSABLE) ×3 IMPLANT
TOWEL OR 17X26 10 PK STRL BLUE (TOWEL DISPOSABLE) ×3 IMPLANT
TRAY FOLEY W/METER SILVER 16FR (SET/KITS/TRAYS/PACK) ×3 IMPLANT
WATER STERILE IRR 1000ML POUR (IV SOLUTION) ×3 IMPLANT

## 2015-12-25 NOTE — Progress Notes (Signed)
Subjective:  The patient is somnolent and in no apparent distress.  Objective: Vital signs in last 24 hours: Temp:  [98.4 F (36.9 C)-99 F (37.2 C)] 99 F (37.2 C) (12/11 2003) Pulse Rate:  [93-106] 106 (12/11 2003) Resp:  [20] 20 (12/11 2003) BP: (114-197)/(64-80) 114/64 (12/11 2003) SpO2:  [97 %-100 %] 100 % (12/11 2003) Weight:  [52.2 kg (115 lb)] 52.2 kg (115 lb) (12/11 0758)  Intake/Output from previous day: No intake/output data recorded. Intake/Output this shift: Total I/O In: 600 [I.V.:600] Out: 120 [Urine:120]  Physical exam the patient is very somnolent. She has been extubated.  Lab Results: No results for input(s): WBC, HGB, HCT, PLT in the last 72 hours. BMET No results for input(s): NA, K, CL, CO2, GLUCOSE, BUN, CREATININE, CALCIUM in the last 72 hours.  Studies/Results: Dg Lumbar Spine 2-3 Views  Result Date: 12/25/2015 CLINICAL DATA:  [facility]at[facility]ell localization imaging for lumbar spine surgery. EXAM: LUMBAR SPINE - 2-3 VIEW COMPARISON:  CT, 11/16/2015 FINDINGS: Initial portable lateral view shows a surgical probe with its tip projecting 3.4 cm posterior to the posterior margin of the L2-L3 disc. Second image shows placement of a surgical probe with its tip projecting over the posterior inferior corner of the L5 vertebra. IMPRESSION: Surgical localization imaging as described. Electronically Signed   By: Amie Portlandavid  Ormond M.D.   On: 12/25/2015 14:27    Assessment/Plan: The patient is doing well. We will wait for her to wake up.  LOS: 0 days     Kimberely Mccannon D 12/25/2015, 8:20 PM

## 2015-12-25 NOTE — Anesthesia Preprocedure Evaluation (Addendum)
Anesthesia Evaluation  Patient identified by MRN, date of birth, ID band Patient awake    Reviewed: Allergy & Precautions, NPO status , Patient's Chart, lab work & pertinent test results  Airway Mallampati: II  TM Distance: >3 FB Neck ROM: Full    Dental  (+) Dental Advisory Given, Upper Dentures   Pulmonary asthma , COPD,  COPD inhaler, Current Smoker,    breath sounds clear to auscultation       Cardiovascular hypertension, Pt. on medications  Rhythm:Regular Rate:Normal     Neuro/Psych PSYCHIATRIC DISORDERS Anxiety Depression    GI/Hepatic GERD  Medicated and Controlled,  Endo/Other    Renal/GU      Musculoskeletal  (+) Fibromyalgia -  Abdominal   Peds  Hematology   Anesthesia Other Findings   Reproductive/Obstetrics                          Anesthesia Physical Anesthesia Plan  ASA: III  Anesthesia Plan: General   Post-op Pain Management:    Induction: Intravenous  Airway Management Planned: Oral ETT  Additional Equipment:   Intra-op Plan:   Post-operative Plan:   Informed Consent: I have reviewed the patients History and Physical, chart, labs and discussed the procedure including the risks, benefits and alternatives for the proposed anesthesia with the patient or authorized representative who has indicated his/her understanding and acceptance.   Dental advisory given  Plan Discussed with: CRNA and Anesthesiologist  Anesthesia Plan Comments:        Anesthesia Quick Evaluation

## 2015-12-25 NOTE — Progress Notes (Signed)
Spoke to cardiology office this morning they had the wrong fax number and are faxing the information over.
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
## 2015-12-25 NOTE — H&P (Signed)
Subjective: The patient is a 70 year old white female who's had 6 prior lumbar surgeries. She has developed intractable back and leg pain consistent with lumbar radiculopathy. She failed medical management and workup of a lumbar MRI and the lumbar myelo CT. This demonstrated a thoracic lumbar scoliosis, degenerative disc disease, stenosis, etc. I discussed the situation with the patient. We discussed the various treatment options including surgery. She has weighed the risks, benefits, and alternatives to surgery and decided to proceed with a L1-S1 decompression, instrumentation, and fusion.   Past Medical History:  Diagnosis Date  . Anxiety   . Arthritis   . Asthma   . Chest pain    down left arm- when she has palpations, stress relation   . Complication of anesthesia    agitated post-op (remote)  . COPD (chronic obstructive pulmonary disease) (HCC)   . DDD (degenerative disc disease), lumbar   . Depression   . DVT (deep venous thrombosis) (HCC)   . Fibromyalgia   . GERD (gastroesophageal reflux disease)   . History of blood transfusion   . History of hiatal hernia   . Hyperlipemia   . Hypertension   . Nausea   . Palpitations   . Trigger finger    2 fingers both hands  . Ulcer, Barrett's     Past Surgical History:  Procedure Laterality Date  . ABDOMINAL HYSTERECTOMY    . BACK SURGERY     6 surgerues, cages.  Marland Kitchen CARDIOVASCULAR STRESS TEST     [facility]; Dr. Marland Mcalpine): Normal Lexiscan perfusion study, EF 72% (01/26/15)   . COLON RESECTION    . COLONOSCOPY     yearly  . COLOSTOMY    . COLOSTOMY REVERSAL    . INCISIONAL HERNIA REPAIR    . TRANSTHORACIC ECHOCARDIOGRAM     [facility]; Dr. Marland Mcalpine): LVEF 50-55%, grade I diastolic dysfunction, mild MAC with mild MR, trace TR, mild aortic sclerosis without AS (01/24/15)  . UPPER GI ENDOSCOPY      Allergies  Allergen Reactions  . Bactrim [Sulfamethoxazole-Trimethoprim] Dermatitis and Rash    Blood blisters   . Ciprofloxacin Hives  . Clindamycin/Lincomycin Hives  . Demerol [Meperidine] Other (See Comments) and Hives    Hallucinations  . Levaquin [Levofloxacin] Swelling and Other (See Comments)    FEVER SWELLING REACTION UNSPECIFIED   . Nitrofurantoin Hives  . Penicillins Hives     Has patient had a PCN reaction causing immediate rash, facial/tongue/throat swelling, SOB or lightheadedness with hypotension:  # # YES # #  Has patient had a PCN reaction causing severe rash involving mucus membranes or skin necrosis:  # # NO # #  Has patient had a PCN reaction that required hospitalization  # # NO # #  Has patient had a PCN reaction occurring within the last 10 years:  # # NO # #  If all of the above answers are "NO", then may proceed with Cephalosporin use.  . Wellbutrin [Bupropion] Hives    Social History  Substance Use Topics  . Smoking status: Current Every Day Smoker    Packs/day: 0.50    Years: 40.00  . Smokeless tobacco: Never Used  . Alcohol use Yes     Comment: rare    History reviewed. No pertinent family history. Prior to Admission medications   Medication Sig Start Date End Date Taking? Authorizing Provider  albuterol (PROVENTIL HFA;VENTOLIN HFA) 108 (90 Base) MCG/ACT inhaler Inhale 2 puffs into the lungs every 6 (six) hours as  needed for wheezing or shortness of breath.    Yes Historical Provider, MD  ALPRAZolam Prudy Feeler(XANAX) 0.5 MG tablet Take 0.5 mg by mouth 3 (three) times daily as needed for anxiety.   Yes Historical Provider, MD  atorvastatin (LIPITOR) 20 MG tablet Take 20 mg by mouth daily at 6 PM.    Yes Historical Provider, MD  B Complex-C (SUPER B COMPLEX PO) Take 1 tablet by mouth daily.   Yes Historical Provider, MD  calcium-vitamin D (OSCAL WITH D) 500-200 MG-UNIT tablet Take 1 tablet by mouth daily with breakfast.    Yes Historical Provider, MD  cetirizine (ZYRTEC) 10 MG tablet Take 10 mg by mouth daily as needed for allergies.   Yes Historical Provider, MD  docusate  sodium (COLACE) 100 MG capsule Take 200 mg by mouth daily.   Yes Historical Provider, MD  DULoxetine (CYMBALTA) 60 MG capsule Take 60 mg by mouth 2 (two) times daily.   Yes Historical Provider, MD  estradiol (ESTRACE) 0.5 MG tablet Take 0.5 mg by mouth daily.    Yes Historical Provider, MD  Fluticasone-Salmeterol (ADVAIR) 250-50 MCG/DOSE AEPB Inhale 1 puff into the lungs 2 (two) times daily.   Yes Historical Provider, MD  FOLIC ACID PO Take 1 tablet by mouth daily.   Yes Historical Provider, MD  gabapentin (NEURONTIN) 100 MG capsule Take 300 mg by mouth 3 (three) times daily.   Yes Historical Provider, MD  losartan (COZAAR) 50 MG tablet Take 50 mg by mouth daily.    Yes Historical Provider, MD  methocarbamol (ROBAXIN) 500 MG tablet Take 1,000 mg by mouth 2 (two) times daily.    Yes Historical Provider, MD  montelukast (SINGULAIR) 10 MG tablet Take 10 mg by mouth at bedtime.   Yes Historical Provider, MD  montelukast (SINGULAIR) 10 MG tablet Take 10 mg by mouth at bedtime.    Yes Historical Provider, MD  Multiple Vitamin (MULTIVITAMIN WITH MINERALS) TABS tablet Take 1 tablet by mouth daily.   Yes Historical Provider, MD  omeprazole (PRILOSEC) 40 MG capsule Take 40 mg by mouth 2 (two) times daily.   Yes Historical Provider, MD  oxyCODONE-acetaminophen (PERCOCET/ROXICET) 5-325 MG tablet Take 1 tablet by mouth 2 (two) times daily as needed for severe pain.   Yes Historical Provider, MD  sucralfate (CARAFATE) 1 g tablet Take 1 g by mouth 4 (four) times daily -  with meals and at bedtime.    Yes Historical Provider, MD  hydrochlorothiazide (HYDRODIURIL) 12.5 MG tablet Take 12.5 mg by mouth daily as needed (for BP readings > 150/90).     Historical Provider, MD  promethazine (PHENERGAN) 25 MG tablet Take 25 mg by mouth every 6 (six) hours as needed for nausea or vomiting.    Historical Provider, MD     Review of Systems  Positive ROS: As above  All other systems have been reviewed and were otherwise  negative with the exception of those mentioned in the HPI and as above.  Objective: Vital signs in last 24 hours: Temp:  [98.4 F (36.9 C)] 98.4 F (36.9 C) (12/11 0758) Pulse Rate:  [93] 93 (12/11 0758) Resp:  [20] 20 (12/11 0758) BP: (156-197)/(78-80) 156/80 (12/11 0907) SpO2:  [97 %] 97 % (12/11 0758) Weight:  [52.2 kg (115 lb)] 52.2 kg (115 lb) (12/11 0758)  General Appearance: Alert, cooperative, no distress, Head: Normocephalic, without obvious abnormality, atraumatic Eyes: PERRL, conjunctiva/corneas clear, EOM's intact,    Ears: Normal  Throat: Normal  Neck: Supple, symmetrical, trachea  midline, no adenopathy; thyroid: No enlargement/tenderness/nodules; no carotid bruit or JVD Back: Symmetric, no curvature, ROM normal, no CVA tenderness. The patient's lumbar incision is well-healed. Lungs: Clear to auscultation bilaterally, respirations unlabored Heart: Regular rate and rhythm, no murmur, rub or gallop Abdomen: Soft, non-tender,, no masses, no organomegaly Extremities: Extremities normal, atraumatic, no cyanosis or edema Pulses: 2+ and symmetric all extremities Skin: Skin color, texture, turgor normal, no rashes or lesions  NEUROLOGIC:   Mental status: alert and oriented, no aphasia, good attention span, Fund of knowledge/ memory ok Motor Exam - grossly normal Sensory Exam - grossly normal Reflexes:  Coordination - grossly normal Gait - grossly normal Balance - grossly normal Cranial Nerves: I: smell Not tested  II: visual acuity  OS: Normal  OD: Normal   II: visual fields Full to confrontation  II: pupils Equal, round, reactive to light  III,VII: ptosis None  III,IV,VI: extraocular muscles  Full ROM  V: mastication Normal  V: facial light touch sensation  Normal  V,VII: corneal reflex  Present  VII: facial muscle function - upper  Normal  VII: facial muscle function - lower Normal  VIII: hearing Not tested  IX: soft palate elevation  Normal  IX,X: gag reflex  Present  XI: trapezius strength  5/5  XI: sternocleidomastoid strength 5/5  XI: neck flexion strength  5/5  XII: tongue strength  Normal    Data Review Lab Results  Component Value Date   WBC 6.5 12/21/2015   HGB 12.3 12/21/2015   HCT 37.5 12/21/2015   MCV 91.0 12/21/2015   PLT 288 12/21/2015   Lab Results  Component Value Date   NA 137 12/21/2015   K 4.1 12/21/2015   CL 101 12/21/2015   CO2 27 12/21/2015   BUN 13 12/21/2015   CREATININE 0.96 12/21/2015   GLUCOSE 104 (H) 12/21/2015   No results found for: INR, PROTIME  Assessment/Plan: L1-2, L2-3, L3-4, L4-5 and L5-S1 degenerative disc disease, stenosis, scoliosis, lumbago, lumbar radiculopathy, neurogenic claudication: I have discussed the situation with the patient. I reviewed her imaging studies with her. We have discussed the various treatment options including surgery. I described the surgical treatment option of an L1-2, L2-3, L3-4, L4-5 and L5-S1 decompression, instrumentation, and fusion. I have described the surgery to her. I have shown her surgical models. We have discussed the risks, benefits, alternatives, expected postoperative course, and likelihood of achieving our goals with surgery. I have answered all her questions. She has decided to proceed with surgery.   Velmer Woelfel D 12/25/2015 11:33 AM

## 2015-12-25 NOTE — Op Note (Signed)
Brief history: The patient is a 70 year old white female who's had 6 prior back surgeries. She has complained of intractable back and leg pain. She has failed medical management. She has been worked up with a lumbar MRI, lumbar x-rays and lumbar myelo CT. This demonstrated severe disc degeneration with stenosis, and a thoraco lumbar scoliosis. I discussed the various treatment options with the patient including surgery. She has weighed the risks, benefits, and alternative surgery and decided proceed with a thoraco lumbar decompression, instrumentation, and fusion.  Preoperative diagnosis: L1-2, L2-3, L3-4, L5-S1 Degenerative disc disease, spinal stenosis , thoraco lumbar scoliosis, lumbago; lumbar radiculopathy  Postoperative diagnosis: The same  Procedure: Bilateral Evlyn ClinesSmith Peterson osteotomies at L1-2, L2-3, L3-4, L4-5 and L5-S1; bilateral redo laminotomy foraminotomy at L1-2, L2-3, L3-4, L4-5 and L5-S1 Laminotomy/foraminotomies to decompress the bilateral L2, L3, L4, L5 and S1 nerve roots(the work required to do this was in addition to the work required to do the posterior lumbar interbody fusion because of the patient's spinal stenosis, facet arthropathy. Etc. requiring a wide decompression of the nerve roots.); L1-2, L2-3, L3-4 and L5-S1 transforaminal lumbar interbody fusion with local morselized autograft bone and Kinnex graft extender; insertion of interbody prosthesis at L1-2, L2-3, L3-4 and L5-S1 (globus peek interbody prosthesis); posterior segmental instrumentation from L1 to S1 with globus titanium pedicle screws and rods; posterior lateral arthrodesis at L1-2, L2-3, L3-4, L4-5 and L5-S1 with local morselized autograft bone and Kinnex bone graft extender, reduction of lumbar scoliosis  Surgeon: Dr. Delma OfficerJeff Vernard Gram  Asst.: Dr. Altamease OilerAndy Pool  Anesthesia: Gen. endotracheal  Estimated blood loss: 500 mL  Drains: None  Complications: None  Description of procedure: The patient was brought to the  operating room by the anesthesia team. General endotracheal anesthesia was induced. The patient was turned to the prone position on the Wilson frame. The patient's lumbosacral region was then prepared with Betadine scrub and Betadine solution. Sterile drapes were applied.  I then injected the area to be incised with Marcaine with epinephrine solution. I then used the scalpel to make a linear midline incision over the L1-2 to L5-S1 interspace, incising through her old surgical scar. I then used electrocautery to perform a bilateral subperiosteal dissection exposing the spinous process and lamina of L1, L2, and the upper sacrum.. We then obtained intraoperative radiograph to confirm our location. We then inserted the Verstrac retractor to provide exposure.  I began the decompression by using the high speed drill to perform laminotomies at L1-2, and L2-3 bilaterally. We then used the Kerrison punches to widen the laminotomy and removed the ligamentum flavum at L1-2 and L2-3 bilaterally. We used the Kerrison punches to remove the medial facets at L1-2, L2-3, L3-4, L4-5 and L5-S1. We performed wide foraminotomies about the bilateral L2, L3, L4, L5 and S1 nerve roots completing the decompression. We did encounter a small ending of the dura. I performed Evlyn ClinesSmith Peterson osteotomies bilaterally at L1-2, L2-3, L3-4, L4-5 and L5-S1.  We now turned attention to the instrumentation. Under fluoroscopic guidance we cannulated the bilateral L1, L2, L3, L4, L5 and S1 pedicles with the bone probe. We then removed the bone probe. We then tapped the pedicle with a 0.5 millimeter tap. We then removed the tap. We probed inside the tapped pedicle with a ball probe to rule out cortical breaches. We then inserted 6.5 x 40 and 45 millimeter pedicle screw into the L1, L2, L3, L4, L5 and S1 pedicles bilaterally under fluoroscopic guidance. We then palpated along the medial aspect  of the pedicles to rule out cortical breaches. There were  none. The nerve roots were not injured. We then connected the unilateral pedicle screws with a lordotic rod which we contoured using the template. We partially reduce the patient's lumbar scoliosis by rotating the rods.  He placed a cross connector between the rods. We then tightened the caps appropriately. This completed the instrumentation from L1-S1 bilaterally.   We now turned our attention to the posterior lumbar interbody fusion. I used a scalpel to incise the intervertebral disc at L1-2, L2-3, L3-4, and L5-S1 on the right. I then performed a partial intervertebral discectomy at L1-2, L2-3, L3-4 and L5-S1 on the right using the pituitary forceps. We prepared the vertebral endplates at L1-2, L2-3, L3-4 and L5-S1 on the right for the fusion by removing the soft tissues with the curettes. We then used the trial spacers to pick the appropriate sized interbody prosthesis. We prefilled his prosthesis with a combination of local morselized autograft bone that we obtained during the decompression as well as Kinnex bone graft extender. We inserted the prefilled prosthesis into the interspace at L1-2, L2-3, L3-4 and L5-S1 on the right, we then extended the prosthesis.. There was a good snug fit of the prosthesis in the interspace. We then filled and the remainder of the intervertebral disc space with local morselized autograft bone and Kinnex. This completed the posterior lumbar interbody arthrodesis.   We now turned our attention to the posterior lateral arthrodesis at L1-2, L2-3, L3-4, L4-5 and L5-S1 bilaterally. We used the high-speed drill to decorticate the remainder of the pars, transverse process at L1-2, L2-3, L3-4, L4-5 and L5-S1 bilaterally. We then applied a combination of local morselized autograft bone and bone morphogenic protein-soaked collagen sponge and Kinnex bone graft extender over these decorticated posterior lateral structures. This completed the posterior lateral arthrodesis.  We then  obtained hemostasis using bipolar electrocautery. We irrigated the wound out with bacitracin solution. We inspected the thecal sac and nerve roots and noted they were well decompressed. I placed DuraSeal over the thin dura. We then removed the retractor. We placed vancomycin powder in the wound. We reapproximated patient's thoracolumbar fascia with interrupted #1 Vicryl suture. We reapproximated patient's subcutaneous tissue with interrupted 2-0 Vicryl suture. The reapproximated patient's skin with Steri-Strips and benzoin. The wound was then coated with bacitracin ointment. A sterile dressing was applied. The drapes were removed. The patient was subsequently returned to the supine position where they were extubated by the anesthesia team. He was then transported to the post anesthesia care unit in stable condition. All sponge instrument and needle counts were reportedly correct at the end of this case.

## 2015-12-25 NOTE — Transfer of Care (Signed)
Immediate Anesthesia Transfer of Care Note  Patient: Carla Lester  Procedure(s) Performed: Procedure(s): LUMBAR ONE-TWO, LUMBAR TWO-THREE, LUMBAR THREE-FOUR, LUMBAR FIVE-SACRAL ONE POSTERIOR LUMBAR INTERBODY FUSION WITH REDO DECOMPRESSION AT LUMBAR ONE-SACRAL ONE, POSTERIOR SEGMENTAL INSTRUMENTATION LUMBAR ONE-SACRAL ONE, POSTERIOR LATERAL ARTHRODESIS LUMBAR ONE-SACRAL ONE (N/A)  Patient Location: PACU  Anesthesia Type:General  Level of Consciousness: sedated, patient cooperative and responds to stimulation  Airway & Oxygen Therapy: Patient Spontanous Breathing and Patient connected to nasal cannula oxygen  Post-op Assessment: Report given to RN, Post -op Vital signs reviewed and stable and Patient moving all extremities X 4  Post vital signs: Reviewed  Last Vitals:  Vitals:   12/25/15 0907 12/25/15 2003  BP: (!) 156/80 114/64  Pulse:  (!) 106  Resp:  20  Temp:  37.2 C    Last Pain:  Vitals:   12/25/15 2003  TempSrc:   PainSc: Asleep         Complications: No apparent anesthesia complications

## 2015-12-25 NOTE — Anesthesia Procedure Notes (Signed)
Procedure Name: Intubation Date/Time: 12/25/2015 11:52 AM Performed by: Lovie CholOCK, Karthik Whittinghill K Pre-anesthesia Checklist: Patient identified, Emergency Drugs available, Suction available and Patient being monitored Patient Re-evaluated:Patient Re-evaluated prior to inductionOxygen Delivery Method: Circle System Utilized Preoxygenation: Pre-oxygenation with 100% oxygen Intubation Type: IV induction Ventilation: Mask ventilation without difficulty and Oral airway inserted - appropriate to patient size Laryngoscope Size: Miller and 2 Grade View: Grade I Tube type: Oral Tube size: 7.0 mm Number of attempts: 1 Airway Equipment and Method: Stylet and Oral airway Placement Confirmation: ETT inserted through vocal cords under direct vision,  positive ETCO2 and breath sounds checked- equal and bilateral Secured at: 20.5 cm Tube secured with: Tape Dental Injury: Teeth and Oropharynx as per pre-operative assessment

## 2015-12-26 ENCOUNTER — Encounter (HOSPITAL_COMMUNITY): Payer: Self-pay | Admitting: Neurosurgery

## 2015-12-26 ENCOUNTER — Inpatient Hospital Stay (HOSPITAL_COMMUNITY): Payer: Medicare Other

## 2015-12-26 LAB — BASIC METABOLIC PANEL
Anion gap: 7 (ref 5–15)
Anion gap: 6 (ref 5–15)
BUN: 14 mg/dL (ref 6–20)
BUN: 9 mg/dL (ref 6–20)
CHLORIDE: 102 mmol/L (ref 101–111)
CHLORIDE: 96 mmol/L — AB (ref 101–111)
CO2: 28 mmol/L (ref 22–32)
CO2: 30 mmol/L (ref 22–32)
CREATININE: 0.91 mg/dL (ref 0.44–1.00)
Calcium: 8.8 mg/dL — ABNORMAL LOW (ref 8.9–10.3)
Calcium: 8.7 mg/dL — ABNORMAL LOW (ref 8.9–10.3)
Creatinine, Ser: 0.76 mg/dL (ref 0.44–1.00)
GFR calc Af Amer: >60 mL/min (ref >60)
GFR calc Af Amer: >60 mL/min (ref >60)
GFR calc non Af Amer: >60 mL/min (ref >60)
GLUCOSE: 122 mg/dL — AB (ref 65–99)
GLUCOSE: 133 mg/dL — AB (ref 65–99)
POTASSIUM: 3.7 mmol/L (ref 3.5–5.1)
Potassium: 4.1 mmol/L (ref 3.5–5.1)
SODIUM: 137 mmol/L (ref 135–145)
Sodium: 132 mmol/L — ABNORMAL LOW (ref 135–145)

## 2015-12-26 LAB — URINALYSIS, ROUTINE W REFLEX MICROSCOPIC
Bilirubin Urine: NEGATIVE
GLUCOSE, UA: NEGATIVE mg/dL
HGB URINE DIPSTICK: NEGATIVE
Ketones, ur: NEGATIVE mg/dL
Leukocytes, UA: NEGATIVE
Nitrite: NEGATIVE
Protein, ur: NEGATIVE mg/dL
SPECIFIC GRAVITY, URINE: 1.045 — AB (ref 1.005–1.030)
pH: 7 (ref 5.0–8.0)

## 2015-12-26 LAB — CBC
HCT: 26.3 % — ABNORMAL LOW (ref 36.0–46.0)
HCT: 25.2 % — ABNORMAL LOW (ref 36.0–46.0)
Hemoglobin: 8.8 g/dL — ABNORMAL LOW (ref 12.0–15.0)
Hemoglobin: 8.4 g/dL — ABNORMAL LOW (ref 12.0–15.0)
MCH: 30 pg (ref 26.0–34.0)
MCH: 29.5 pg (ref 26.0–34.0)
MCHC: 33.5 g/dL (ref 30.0–36.0)
MCHC: 33.3 g/dL (ref 30.0–36.0)
MCV: 89.8 fL (ref 78.0–100.0)
MCV: 88.4 fL (ref 78.0–100.0)
PLATELETS: 208 10*3/uL (ref 150–400)
PLATELETS: 222 10*3/uL (ref 150–400)
RBC: 2.93 MIL/uL — ABNORMAL LOW (ref 3.87–5.11)
RBC: 2.85 MIL/uL — AB (ref 3.87–5.11)
RDW: 14.3 % (ref 11.5–15.5)
RDW: 13.8 % (ref 11.5–15.5)
WBC: 13.1 10*3/uL — ABNORMAL HIGH (ref 4.0–10.5)
WBC: 11.6 10*3/uL — ABNORMAL HIGH (ref 4.0–10.5)

## 2015-12-26 MED ORDER — IOPAMIDOL (ISOVUE-300) INJECTION 61%
INTRAVENOUS | Status: AC
  Filled 2015-12-26: qty 30

## 2015-12-26 MED ORDER — IOPAMIDOL (ISOVUE-300) INJECTION 61%
INTRAVENOUS | Status: AC
  Administered 2015-12-26: 100 mL
  Filled 2015-12-26: qty 100

## 2015-12-26 MED FILL — Thrombin For Soln 20000 Unit: CUTANEOUS | Qty: 1 | Status: AC

## 2015-12-26 MED FILL — Heparin Sodium (Porcine) Inj 1000 Unit/ML: INTRAMUSCULAR | Qty: 30 | Status: AC

## 2015-12-26 MED FILL — Sodium Chloride IV Soln 0.9%: INTRAVENOUS | Qty: 1000 | Status: AC

## 2015-12-26 NOTE — Care Management Note (Signed)
Case Management Note  Patient Details  Name: Carla SolomonsMary Lester MRN: [id number] Date of Birth: 12-Dec-1945  Subjective/Objective:      Pt underwent:LUMBAR ONE-TWO, LUMBAR TWO-THREE, LUMBAR THREE-FOUR, LUMBAR FIVE-SACRAL ONE POSTERIOR LUMBAR INTERBODY FUSION WITH REDO DECOMPRESSION AT LUMBAR ONE-SACRAL ONE, POSTERIOR SEGMENTAL INSTRUMENTATION LUMBAR ONE-SACRAL ONE, POSTERIOR LATERAL ARTHRODESIS LUMBAR ONE-SACRAL ONE. She is from home with family.               Action/Plan: Awaiting PT/OT recommendations. CM following for d/c needs.   Expected Discharge Date:                  Expected Discharge Plan:     In-House Referral:     Discharge planning Services     Post Acute Care Choice:    Choice offered to:     DME Arranged:    DME Agency:     HH Arranged:    HH Agency:     Status of Service:  In process, will continue to follow  If discussed at Long Length of Stay Meetings, dates discussed:    Additional Comments:  Kermit BaloKelli F Grier Vu, RN 12/26/2015, 10:46 AM

## 2015-12-26 NOTE — Progress Notes (Signed)
PT Cancellation Note  Patient Details Name: Arlie SolomonsMary Radney MRN: [id number] DOB: 21-Sep-1945   Cancelled Treatment:    Reason Eval/Treat Not Completed: Other (comment)   Patient remains on bedrest. Will check back after MD rounds today and proceed if activity is increased.   Scherrie NovemberLynn P Buffey Zabinski 12/26/2015, 8:16 AM Pager [phone]

## 2015-12-26 NOTE — Progress Notes (Signed)
PT Cancellation Note  Patient Details Name: Carla Lester MRN: [id number] DOB: [birth year]   Cancelled Treatment:    Reason Eval/Treat Not Completed: Patient not medically ready   Noted Dr. Lovell Lester note states to remain on bedrest today. Will complete PT evaluation 12/13 as appropriate.   Carla Lester 12/26/2015, 11:22 AM Pager [phone]

## 2015-12-26 NOTE — Progress Notes (Signed)
Patient ID: Carla Lester, female   DOB: 10-21-1945, 70 y.o.   MRN: [id number] Subjective:  the patient is alert and pleasant. She looks well.  She denies headaches.  She's had some nausea and vomiting.  She is passing flatus.  Objective: Vital signs in last 24 hours: Temp:  [97.5 F (36.4 C)-99 F (37.2 C)] 98.2 F (36.8 C) (12/12 0514) Pulse Rate:  [105-115] 110 (12/12 0514) Resp:  [12-21] 16 (12/12 0514) BP: (90-157)/(59-88) 157/88 (12/12 0514) SpO2:  [93 %-100 %] 99 % (12/12 0514) Weight:  [57.9 kg (127 lb 10.3 oz)] 57.9 kg (127 lb 10.3 oz) (12/11 2052)  Intake/Output from previous day: 12/11 0701 - 12/12 0700 In: 3975 [I.V.:3475; IV Piggyback:500] Out: 1365 [Urine:915; Blood:450] Intake/Output this shift: Total I/O In: 60 [P.O.:60] Out: -   Physical exam the patient is alert and pleasant.  Her strength is grossly normal in her bilateral gastrocnemius and dorsiflexor.  Her dressing is clean and dry.  Lab Results:  Recent Labs  12/26/15 0238  WBC 13.1*  HGB 8.8*  HCT 26.3*  PLT 208   BMET  Recent Labs  12/26/15 0238  NA 137  K 4.1  CL 102  CO2 28  GLUCOSE 122*  BUN 14  CREATININE 0.91  CALCIUM 8.8*    Studies/Results: Dg Lumbar Spine 2-3 Views  Result Date: 12/25/2015 CLINICAL DATA:  Larey SeatFell localization imaging for lumbar spine surgery. EXAM: LUMBAR SPINE - 2-3 VIEW COMPARISON:  CT, 11/16/2015 FINDINGS: Initial portable lateral view shows a surgical probe with its tip projecting 3.4 cm posterior to the posterior margin of the L2-L3 disc. Second image shows placement of a surgical probe with its tip projecting over the posterior inferior corner of the L5 vertebra. IMPRESSION: Surgical localization imaging as described. Electronically Signed   By: Amie Portlandavid  Ormond M.D.   On: 12/25/2015 14:27   Dg Lumbar Spine 1 View  Result Date: 12/25/2015 CLINICAL DATA:  Lumbar fusion EXAM: LUMBAR SPINE - 1 VIEW; DG C-ARM 61-120 MIN COMPARISON:  12/25/2015 FLUOROSCOPY TIME:   Radiation Exposure Index (as provided by the fluoroscopic device): Not available If the device does not provide the exposure index: Fluoroscopy Time:  1 minutes 13 seconds Number of Acquired Images:  1 FINDINGS: Pedicle screws with posterior fixation are seen extending from L1-S1. Fusion prosthesis is the disc space at all levels. The L4-L5 fusion is stable from the prior study. IMPRESSION: L1-2 S1 fusion. Electronically Signed   By: Alcide CleverMark  Lukens M.D.   On: 12/25/2015 20:45   Dg C-arm 61-120 Min  Result Date: 12/25/2015 CLINICAL DATA:  Lumbar fusion EXAM: LUMBAR SPINE - 1 VIEW; DG C-ARM 61-120 MIN COMPARISON:  12/25/2015 FLUOROSCOPY TIME:  Radiation Exposure Index (as provided by the fluoroscopic device): Not available If the device does not provide the exposure index: Fluoroscopy Time:  1 minutes 13 seconds Number of Acquired Images:  1 FINDINGS: Pedicle screws with posterior fixation are seen extending from L1-S1. Fusion prosthesis is the disc space at all levels. The L4-L5 fusion is stable from the prior study. IMPRESSION: L1-2 S1 fusion. Electronically Signed   By: Alcide CleverMark  Lukens M.D.   On: 12/25/2015 20:45    Assessment/Plan: Postop day #1: We will keep the patient at bed rest today and mobilize her tomorrow.  Acute blood loss anemia: I'll plan to repeat her CBC tomorrow.  She is not presently symptomatic.  LOS: 1 day     Carla Lester D 12/26/2015, 10:02 AM

## 2015-12-26 NOTE — Progress Notes (Signed)
Patient is stating she is having severe abdominal pain in the lower quadrants . Patient has had a stool softer, and also a suppository. Patient also states she is having Nausea, and Zofran was given. Patient did have a small bowel movement. Patient states she has had no relief from any of the interventions, and is feeling worst. Bowel sounds are active in all quadrants. On call MD orderrd CBC, CT scan  and also UA. Will continue to monitor

## 2015-12-26 NOTE — Anesthesia Postprocedure Evaluation (Signed)
Anesthesia Post Note  Patient: Missi Corigliano  Procedure(s) Performed: Procedure(s) (LRB): LUMBAR ONE-TWO, LUMBAR TWO-THREE, LUMBAR THREE-FOUR, LUMBAR FIVE-SACRAL ONE POSTERIOR LUMBAR INTERBODY FUSION WITH REDO DECOMPRESSION AT LUMBAR ONE-SACRAL ONE, POSTERIOR SEGMENTAL INSTRUMENTATION LUMBAR ONE-SACRAL ONE, POSTERIOR LATERAL ARTHRODESIS LUMBAR ONE-SACRAL ONE (N/A)  Patient location during evaluation: PACU Anesthesia Type: General Level of consciousness: awake and alert Pain management: pain level controlled Vital Signs Assessment: post-procedure vital signs reviewed and stable Respiratory status: spontaneous breathing, nonlabored ventilation, respiratory function stable and patient connected to nasal cannula oxygen Cardiovascular status: stable Postop Assessment: no signs of nausea or vomiting Anesthetic complications: no    Last Vitals:  Vitals:   12/25/15 2052 12/26/15 0106  BP: 109/63 (!) 142/77  Pulse: (!) 115 (!) 107  Resp:  16  Temp: 36.4 C 37 C    Last Pain:  Vitals:   12/26/15 0117  TempSrc:   PainSc: 10-Worst pain ever                 Gina Costilla

## 2015-12-26 NOTE — Progress Notes (Signed)
Orthopedic Tech Progress Note Patient Details:  [name] June 29, 1945 [id number]  Patient ID: [name], female   DOB: June 29, 1945, 70 y.o.   MRN: [id number]   Saul FordyceJennifer C Charliee Krenz 12/26/2015, 10:43 AMCalled [employer] for Lumbar bar.

## 2015-12-27 LAB — CBC
HEMATOCRIT: 24.8 % — AB (ref 36.0–46.0)
Hemoglobin: 8.2 g/dL — ABNORMAL LOW (ref 12.0–15.0)
MCH: 30 pg (ref 26.0–34.0)
MCHC: 33.1 g/dL (ref 30.0–36.0)
MCV: 90.8 fL (ref 78.0–100.0)
Platelets: 184 10*3/uL (ref 150–400)
RBC: 2.73 MIL/uL — ABNORMAL LOW (ref 3.87–5.11)
RDW: 14.3 % (ref 11.5–15.5)
WBC: 10.9 10*3/uL — ABNORMAL HIGH (ref 4.0–10.5)

## 2015-12-27 MED ORDER — METRONIDAZOLE IN NACL 5-0.79 MG/ML-% IV SOLN
500.0000 mg | Freq: Three times a day (TID) | INTRAVENOUS | Status: DC
  Administered 2015-12-27 – 2015-12-29 (×7): 500 mg via INTRAVENOUS
  Filled 2015-12-27 (×9): qty 100

## 2015-12-27 MED ORDER — DEXTROSE 5 % IV SOLN
2.0000 g | Freq: Every day | INTRAVENOUS | Status: DC
  Administered 2015-12-27 – 2015-12-29 (×3): 2 g via INTRAVENOUS
  Filled 2015-12-27 (×3): qty 2

## 2015-12-27 NOTE — Evaluation (Addendum)
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 Patient limited in mobility by orthostasis (symptomatic) and abd pain (she could not tolerate donning brace due to abd pain). Patient will benefit from skilled PT to increase their independence and safety with mobility (while adhering to their precautions) to allow discharge to the venue listed below.     Follow Up Recommendations Other (comment) (TBA as orthostasis resolves)    Equipment Recommendations  None recommended by PT    Recommendations for Other Services OT consult     Precautions / Restrictions Precautions Precautions: Fall;Back Precaution Booklet Issued: Yes (comment) Precaution Comments: pt unable to state any precautions (although dtr knew them); focused on her abd pain Required Braces or Orthoses: Spinal Brace Spinal Brace: Lumbar corset;Applied in sitting position      Mobility  Bed Mobility Overal bed mobility: Needs Assistance Bed Mobility: Rolling;Sidelying to Sit;Sit to Sidelying Rolling: Min assist Sidelying to sit: Min assist     Sit to sidelying: Min guard General bed mobility comments: +rail; vc to maintain back precautions; assist to fully roll and raise torso  Transfers                 General transfer comment: unable due to orthostasis  Ambulation/Gait                Stairs            Wheelchair Mobility    Modified Rankin (Stroke Patients Only)       Balance                                             Pertinent Vitals/Pain See vitals flow sheet.   Pain Assessment:  0-10 Pain Score: 10-Worst pain ever Pain Location: abd and back Pain Descriptors / Indicators: Operative site guarding;Grimacing;Guarding Pain Intervention(s): Limited activity within patient's tolerance;Monitored during session;Premedicated before session;Repositioned    Home Living Family/patient expects to be discharged to:: Private residence Living Arrangements: Alone ([place] Available Help at Discharge: Family;Available 24 hours/day (daughter in town x 4 weeks) Type of Home: House Home Access: Stairs to enter Entrance Stairs-Rails: Doctor, general practiceight;Left Entrance Stairs-Number of Steps: 4 Home Layout: One level Home Equipment: Grab bars - tub/shower;Shower seat - built in;Grab bars - toilet;Walker - 2 wheels;Walker - 4 wheels;Cane - single point Additional Comments: sleeps on sofa    Prior Function Level of Independence: Independent               Hand Dominance        Extremity/Trunk Assessment   Upper Extremity Assessment: Overall WFL for tasks assessed           Lower Extremity Assessment: Overall WFL for tasks assessed (limited assessment due to orthostasis and need for bedpan)         Communication   Communication: No difficulties  Cognition Arousal/Alertness: Awake/alert Behavior During Therapy: Anxious (in pain) Overall Cognitive Status: Within Functional Limits for tasks assessed  General Comments      Exercises     Assessment/Plan    PT Assessment Patient needs continued PT services  PT Problem List Decreased activity tolerance          PT Treatment Interventions DME instruction;Gait training;Stair training;Functional mobility training;Therapeutic activities;Patient/family education    PT Goals (Current goals can be found in the Care Plan section)  Acute Rehab PT Goals Patient Stated Goal: decr pain PT Goal Formulation: With patient Time For Goal Achievement: 01/03/16 Potential to Achieve  Goals: Good    Frequency Min 5X/week   Barriers to discharge        Co-evaluation               End of Session Equipment Utilized During Treatment: Back brace Activity Tolerance: Patient limited by pain;Treatment limited secondary to medical complications (Comment) (orthostasis) Patient left: in bed;with call bell/phone within reach;with family/visitor present (on bedpan) Nurse Communication: Mobility status (orthostasis; on bedpan)         Time: 1610-96041020-1042 PT Time Calculation (min) (ACUTE ONLY): 22 min   Charges:   PT Evaluation $PT Eval Low Complexity: 1 Procedure     PT G CodesScherrie Lester:        Carla Lester Carla Lester 12/27/2015, 10:59 AM Pager [phone]

## 2015-12-28 LAB — GASTROINTESTINAL PANEL BY PCR, STOOL (REPLACES STOOL CULTURE)

## 2015-12-28 MED ORDER — FERROUS SULFATE 325 (65 FE) MG PO TABS
325.0000 mg | ORAL_TABLET | Freq: Two times a day (BID) | ORAL | Status: DC
  Administered 2015-12-28 – 2015-12-29 (×2): 325 mg via ORAL
  Filled 2015-12-28 (×2): qty 1

## 2015-12-28 NOTE — Progress Notes (Addendum)
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  Pt able to ambulate 80 ft with min assist.  Will inform supervising PT of patient progress and need for change in recommendations now that orthostasis has resolved.  Pt will require HHPT at d/c.   Pt will require stair training next session to ensure safe entry into home.     Follow Up Recommendations  Home health PT;Supervision/Assistance - 24 hour     Equipment Recommendations  None recommended by PT    Recommendations for Other Services       Precautions / Restrictions Precautions Precautions: Fall;Back Precaution Comments: Pt able to state 2/3 precautions.  required cues for do not lift Required Braces or Orthoses: Spinal Brace Spinal Brace: Lumbar corset;Applied in sitting position Restrictions Weight Bearing Restrictions: No    Mobility  Bed Mobility Overal bed mobility: Needs Assistance   Rolling: Supervision Sidelying to sit: Supervision     Sit to sidelying: Supervision General bed mobility comments: Pt used bed rail no cues needed supervision for safety to ensure correct technique.    Transfers Overall transfer level: Needs assistance Equipment used: Rolling walker (2 wheeled) Transfers: Sit to/from Stand Sit to Stand: Min assist         General transfer comment: Cues for hand placement to and from seated surface.  Cues for B knee extension with slow rise into standing.    Ambulation/Gait Ambulation/Gait assistance: Min assist;Min guard Ambulation Distance (Feet): 80 Feet Assistive device: Rolling walker (2 wheeled) Gait Pattern/deviations: Step-through pattern;Decreased stride  length;Narrow base of support;Trunk flexed Gait velocity: decreased Gait velocity interpretation: Below normal speed for age/gender General Gait Details: Cues for upper trunk control, avoid obstacles in halls and maintaining position of RW.  Pt tolerated tx well.  Fatigue observed during last 20 ft.     Stairs            Wheelchair Mobility    Modified Rankin (Stroke Patients Only)       Balance Overall balance assessment: Needs assistance   Sitting balance-Leahy Scale: Good       Standing balance-Leahy Scale: Fair                      Cognition Arousal/Alertness: Awake/alert Behavior During Therapy: WFL for tasks assessed/performed Overall Cognitive Status: Within Functional Limits for tasks assessed                      Exercises      General Comments        Pertinent Vitals/Pain Pain Assessment: 0-10 Pain Score: 5  Pain Location: lower abd  Pain Descriptors / Indicators: Grimacing;Guarding Pain Intervention(s): Premedicated before session;Monitored during session;Repositioned    Home Living                      Prior Function            PT Goals (current goals can now be found in the care plan section) Acute Rehab PT Goals Patient Stated Goal: decr pain Potential to Achieve Goals: Good Progress towards PT goals: Progressing toward goals    Frequency    Min 5X/week  PT Plan Discharge plan needs to be updated    Co-evaluation             End of Session Equipment Utilized During Treatment: Back brace;Gait belt Activity Tolerance: Patient tolerated treatment well;Patient limited by fatigue Patient left: with call bell/phone within reach;with bed alarm set;in bed;with family/visitor present     Time: 4098-11911155-1215 PT Time Calculation (min) (ACUTE ONLY): 20 min  Charges:  $Gait Training: 8-22 mins                    G Codes:      Carla Lester 12/28/2015, 12:49 PM  Carla Lester, PTA pager  [phone]

## 2015-12-28 NOTE — Progress Notes (Signed)
Patient ID: Carla Lester, female   DOB: Jul 19, 1945, 70 y.o.   MRN: [id number] Subjective:  The patient is alert and pleasant. She looks well. She complains of abdominal pain. She's had a bowel movement and has been passing flatus. She has a history of chronic abdominal pain and [facility] He performed a asked me on her in May which by her report was okay.  Objective: Vital signs in last 24 hours: Temp:  [97.7 F (36.5 C)-98.6 F (37 C)] 97.7 F (36.5 C) (12/14 0535) Pulse Rate:  [63-118] 107 (12/14 0535) Resp:  [18-20] 18 (12/14 0535) BP: (109-167)/(54-81) 126/60 (12/14 0535) SpO2:  [92 %-98 %] 93 % (12/14 0800)  Intake/Output from previous day: 12/13 0701 - 12/14 0700 In: 350 [IV Piggyback:350] Out: 800 [Urine:800] Intake/Output this shift: No intake/output data recorded.  Physical exam the patient is alert and pleasant. She is in no apparent distress. Her strength is grossly normal in her lower extremities. Her dressing is clean and dry.  The patient's abdomen is nondistended and is soft.  Lab Results:  Recent Labs  12/26/15 1857 12/27/15 0418  WBC 11.6* 10.9*  HGB 8.4* 8.2*  HCT 25.2* 24.8*  PLT 222 184   BMET  Recent Labs  12/26/15 0238 12/26/15 1857  NA 137 132*  K 4.1 3.7  CL 102 96*  CO2 28 30  GLUCOSE 122* 133*  BUN 14 9  CREATININE 0.91 0.76  CALCIUM 8.8* 8.7*    Studies/Results: Ct Abdomen Pelvis W Contrast  Result Date: 12/26/2015 CLINICAL DATA:  70 year old female with lower abdominal pain, nausea vomiting. History of hiatal hernia. EXAM: CT ABDOMEN AND PELVIS WITH CONTRAST TECHNIQUE: Multidetector CT imaging of the abdomen and pelvis was performed using the standard protocol following bolus administration of intravenous contrast. CONTRAST:  1 ISOVUE-300 IOPAMIDOL (ISOVUE-300) INJECTION 61% COMPARISON:  Lumbar spine CT myelogram dated 11/16/2015. FINDINGS: Lower chest: The visualized lung  bases are clear. Trace bilateral pleural effusions. No intra-abdominal free air or free fluid. Hepatobiliary: Scattered subcentimeter hepatic hypodensities are too small to characterize but most likely represent cysts or hemangiomas. The liver is otherwise unremarkable. No intrahepatic biliary ductal dilatation. The gallbladder is unremarkable as well. Pancreas: Unremarkable. No pancreatic ductal dilatation or surrounding inflammatory changes. Spleen: Normal in size without focal abnormality. Adrenals/Urinary Tract: The adrenal glands appear unremarkable. Multiple small nonobstructing right renal calculi measure up to 4 mm in the inferior pole of the right kidney. No hydronephrosis. There is no hydronephrosis or nephrolithiasis on the left. Scattered subcentimeter bilateral renal hypodense lesions are too small to characterize but most likely represent cysts. The visualized ureters appear unremarkable. A Foley catheter and air noted within the urinary bladder. Stomach/Bowel: There is fluid content in the distal esophagus compatible with gastroesophageal reflux. Liquid content noted in the stomach. There is abutment of multiple loops of small bowel to the anterior peritoneal wall and ventral hernia repair mesh compatible with adhesions. There is no evidence of bowel obstruction. There is postsurgical changes of partial sigmoid resection with anastomotic suture. There is mild thickening and inflammatory changes of the rectosigmoid concerning for colitis. Loose stool noted in the rectosigmoid. Correlation with clinical exam and stool cultures recommended. There is diffuse colonic diverticulosis without active inflammatory changes. Normal appendix. Vascular/Lymphatic: There is advanced aortoiliac atherosclerotic disease. The origins of the celiac axis, SMA, IMA appear patent. The SMV, and main portal vein are patent. No portal venous gas identified. There is no adenopathy. Reproductive:  Hysterectomy. Other: Anterior  abdominal wall surgical scar noted. A ventral hernia repair mesh is seen. There is a 1.5 cm peritoneal defect in the left anterior abdominal wall superior to the mesh containing a small herniated omental fat. Musculoskeletal: Evaluation of the osseous structures is limited due to streak artifact caused by fixation hardware. There has been interval placement of posterior pedicular fixation screws and metallic disc spacers at L1-S1. L4-L5 solid metallic cage was present on the prior CT. The orthopedic hardware appears intact. L3-L5 laminectomy changes noted. Air is noted in the paraspinal soft tissues extending into the central canal, likely postsurgical. Small pockets of air and fluid also noted in the subcutaneous soft tissues of the posterior lumbar region along the incisional line. No large hematoma or drainable fluid collection/abscess. Old-appearing left tenth and eleventh posterior rib fractures. Faint linear lucency through the left posterior twelfth rib (series 2, image 25) may be artifactual or represent a nondisplaced fracture. Correlation with point tenderness recommended. No other acute fracture identified. IMPRESSION: Segmental thickening and mild inflammatory changes of the rectosigmoid concerning for colitis. Correlation with clinical exam and stool cultures recommended. There is no bowel obstruction. Normal appendix. Diverticulosis. Extensive postsurgical changes of the lumbar spine with interval placement of posterior fusion hardware and interbody spacers at L1-S1. There is postsurgical changes in the surrounding paraspinal soft tissues with pockets of air extending into the central canal. No drainable fluid collection/abscess or large hematoma identified. Artifact versus less likely a nondisplaced left posterior twelfth rib fracture. Trace bilateral pleural effusions. Electronically Signed   By: Elgie CollardArash  Radparvar M.D.   On: 12/26/2015 22:27    Assessment/Plan: Postop day #3: We will continue to  mobilize the patient. Her instrumentation looks good on her CT of the abdomen and pelvis.  Abdominal pain: This has been a chronic issue for her. Her CT of the abdomen and pelvis demonstrates some colitis. We will continue to mobilize her. She is passing gas and having bowel movements.  Acute blood loss anemia: I will add iron and repeat her CBC tomorrow.  The patient wants to go home. We may be able to send her home tomorrow if she is doing well.  LOS: 3 days     Glora Hulgan D 12/28/2015, 9:53 AM

## 2015-12-28 NOTE — Progress Notes (Signed)
Patient complained of abdominal discomfort with some bloating. Scheduled Carafate and PRN Mylanta given with some relief. Report called from lab of positive Enteropathogenic E. Coli. MD paged at his office and awaiting response at this time.

## 2015-12-28 NOTE — Progress Notes (Signed)
CRITICAL VALUE ALERT  Critical value received:  Enteropathogenic E. Coli (EPEC) Date of notification:  12/28/15  Time of notification:  1410  Critical value read back:Yes.    Nurse who received alert:  Shigeo Baugh Daphne Tom-Johnson RN  MD notified (1st page):  Yes  Time of first page:  1420  MD notified (2nd page):  Time of second page:  Responding MD:    Time MD responded:

## 2015-12-29 LAB — CBC
HCT: 24.1 % — ABNORMAL LOW (ref 36.0–46.0)
Hemoglobin: 8.1 g/dL — ABNORMAL LOW (ref 12.0–15.0)
MCH: 30.2 pg (ref 26.0–34.0)
MCHC: 33.6 g/dL (ref 30.0–36.0)
MCV: 89.9 fL (ref 78.0–100.0)
PLATELETS: 222 10*3/uL (ref 150–400)
RBC: 2.68 MIL/uL — AB (ref 3.87–5.11)
RDW: 14.1 % (ref 11.5–15.5)
WBC: 8.7 10*3/uL (ref 4.0–10.5)

## 2015-12-29 MED ORDER — OXYCODONE-ACETAMINOPHEN 10-325 MG PO TABS: 1.0000 | ORAL_TABLET | ORAL | 0 refills | Status: AC | PRN

## 2015-12-29 MED ORDER — FERROUS SULFATE 325 (65 FE) MG PO TABS: 325.0000 mg | ORAL_TABLET | Freq: Every day | ORAL | 0 refills | Status: AC

## 2015-12-29 NOTE — Care Management Important Message (Signed)
Important Message  Patient Details  Name: Carla SolomonsMary Sweeten MRN: [id number] Date of Birth: May 26, 1945   Medicare Important Message Given:  Yes    Andriea Hasegawa Abena 12/29/2015, 10:04 AM

## 2015-12-29 NOTE — Discharge Summary (Signed)
Physician Discharge Summary  Patient ID: Carla Lester MRN: [id number] DOB/AGE: 68-16-1947 68 y.o.  Admit date: 12/25/2015 Discharge date: 12/29/2015  Admission Diagnoses:Lumbar degenerative disc disease, lumbar scoliosis, lumbago, lumbar radiculopathy  Discharge Diagnoses: The same Active Problems:   Scoliosis of thoracolumbar spine   Discharged Condition: good  Hospital Course: I performed an L1 to ilium decompression, instrumentation, and fusion on the patient on 12/25/2015.  The patient's postoperative course was remarkable for some chronic abdominal pain. This is a chronic issue for the patient. She sees Dr. Richardson Dopp a gastroenterologist in [facility] for this. She has made an appointment to see him on 01/01/2016.  The patient also had postoperative anemia. This is being treated with ferrous sulfate. Her hemoglobin has been stable around 8. He is clinically asymptomatic. She was instructed to have her hemoglobin checked next week.  On 12/29/2015 the patient requested discharge to home. The patient, and her daughter, were given written and oral discharge instructions. All her questions were answered.  Consults: Physical therapy Significant Diagnostic Studies: CT of the abdomen and pelvis Treatments: L1 to the ilium decompression, instrumentation, and fusion. Discharge Exam: Blood pressure 124/63, pulse (!) 105, temperature 98.2 F (36.8 C), temperature source Oral, resp. rate 16, height 5\' 2"  (1.575 m), weight 57.9 kg (127 lb 10.3 oz), SpO2 93 %. The patient is alert and pleasant. She looks well. Her strength is normal in her lower extremities. Her dressing is clean and dry. The patient's abdomen is soft and nontender. She has been passing flatus.  Disposition: Home  Discharge Instructions    Call MD for:  difficulty breathing, headache or visual disturbances    Complete by:  As directed    Call MD for:  extreme fatigue    Complete by:  As directed    Call MD  for:  hives    Complete by:  As directed    Call MD for:  persistant dizziness or light-headedness    Complete by:  As directed    Call MD for:  persistant nausea and vomiting    Complete by:  As directed    Call MD for:  redness, tenderness, or signs of infection (pain, swelling, redness, odor or green/yellow discharge around incision site)    Complete by:  As directed    Call MD for:  severe uncontrolled pain    Complete by:  As directed    Call MD for:  temperature >100.4    Complete by:  As directed    Diet - low sodium heart healthy    Complete by:  As directed    Discharge instructions    Complete by:  As directed    Call [phone] for a followup appointment. Take a stool softener while you are using pain medications.   Driving Restrictions    Complete by:  As directed    Do not drive for 2 weeks.   Increase activity slowly    Complete by:  As directed    Lifting restrictions    Complete by:  As directed    Do not lift more than 5 pounds. No excessive bending or twisting.   May shower / Bathe    Complete by:  As directed    He may shower after the pain she is removed 3 days after surgery. Leave the incision alone.   No dressing needed    Complete by:  As directed      Allergies as of 12/29/2015      Reactions  Bactrim [sulfamethoxazole-trimethoprim] Dermatitis, Rash   Blood blisters   Ciprofloxacin Hives   Clindamycin/lincomycin Hives   Demerol [meperidine] Other (See Comments), Hives   Hallucinations   Levaquin [levofloxacin] Swelling, Other (See Comments)   FEVER SWELLING REACTION UNSPECIFIED    Nitrofurantoin Hives   Penicillins Hives   Has patient had a PCN reaction causing immediate rash, facial/tongue/throat swelling, SOB or lightheadedness with hypotension:  # # YES # #  Has patient had a PCN reaction causing severe rash involving mucus membranes or skin necrosis:  # # NO # #  Has patient had a PCN reaction that required hospitalization  # # NO # #   Has patient had a PCN reaction occurring within the last 10 years:  # # NO # #  If all of the above answers are "NO", then may proceed with Cephalosporin use.   Wellbutrin [bupropion] Hives      Medication List    STOP taking these medications   oxyCODONE-acetaminophen 5-325 MG tablet Commonly known as:  PERCOCET/ROXICET Replaced by:  oxyCODONE-acetaminophen 10-325 MG tablet     TAKE these medications   albuterol 108 (90 Base) MCG/ACT inhaler Commonly known as:  PROVENTIL HFA;VENTOLIN HFA Inhale 2 puffs into the lungs every 6 (six) hours as needed for wheezing or shortness of breath.   ALPRAZolam 0.5 MG tablet Commonly known as:  XANAX Take 0.5 mg by mouth 3 (three) times daily as needed for anxiety.   atorvastatin 20 MG tablet Commonly known as:  LIPITOR Take 20 mg by mouth daily at 6 PM.   calcium-vitamin D 500-200 MG-UNIT tablet Commonly known as:  OSCAL WITH D Take 1 tablet by mouth daily with breakfast.   cetirizine 10 MG tablet Commonly known as:  ZYRTEC Take 10 mg by mouth daily as needed for allergies.   docusate sodium 100 MG capsule Commonly known as:  COLACE Take 200 mg by mouth daily.   DULoxetine 60 MG capsule Commonly known as:  CYMBALTA Take 60 mg by mouth 2 (two) times daily.   estradiol 0.5 MG tablet Commonly known as:  ESTRACE Take 0.5 mg by mouth daily.   ferrous sulfate 325 (65 FE) MG tablet Take 1 tablet (325 mg total) by mouth daily with breakfast.   Fluticasone-Salmeterol 250-50 MCG/DOSE Aepb Commonly known as:  ADVAIR Inhale 1 puff into the lungs 2 (two) times daily.   FOLIC ACID PO Take 1 tablet by mouth daily.   gabapentin 100 MG capsule Commonly known as:  NEURONTIN Take 300 mg by mouth 3 (three) times daily.   hydrochlorothiazide 12.5 MG tablet Commonly known as:  HYDRODIURIL Take 12.5 mg by mouth daily as needed (for BP readings > 150/90).   losartan 50 MG tablet Commonly known as:  COZAAR Take 50 mg by mouth daily.    methocarbamol 500 MG tablet Commonly known as:  ROBAXIN Take 1,000 mg by mouth 2 (two) times daily.   montelukast 10 MG tablet Commonly known as:  SINGULAIR Take 10 mg by mouth at bedtime.   montelukast 10 MG tablet Commonly known as:  SINGULAIR Take 10 mg by mouth at bedtime.   multivitamin with minerals Tabs tablet Take 1 tablet by mouth daily.   omeprazole 40 MG capsule Commonly known as:  PRILOSEC Take 40 mg by mouth 2 (two) times daily.   oxyCODONE-acetaminophen 10-325 MG tablet Commonly known as:  PERCOCET Take 1 tablet by mouth every 4 (four) hours as needed for pain. Replaces:  oxyCODONE-acetaminophen 5-325 MG tablet  promethazine 25 MG tablet Commonly known as:  PHENERGAN Take 25 mg by mouth every 6 (six) hours as needed for nausea or vomiting.   sucralfate 1 g tablet Commonly known as:  CARAFATE Take 1 g by mouth 4 (four) times daily -  with meals and at bedtime.   SUPER B COMPLEX PO Take 1 tablet by mouth daily.        SignedTressie Stalker: Nitza Schmid D 12/29/2015, 7:50 AM

## 2015-12-29 NOTE — Progress Notes (Signed)
Pt discharging at this time with [place]. Surgical dressing site dry and intact. IV discontinued, dry dressing applied. Discharge instructions provided with prescription with verbal understanding.  No noted distress. Will follow up with Md per dc summary.

## 2015-12-29 NOTE — Progress Notes (Signed)
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  Pt required education on brace application.  Daughter present and able to verbalize and teach back method to PTA.  Pt ready for d/c home.  Tx limited due to pain.  Pt will continue to benefit from HHPT at home to ensure safety.    Follow Up Recommendations  Home health PT;Supervision/Assistance - 24 hour     Equipment Recommendations  None recommended by PT    Recommendations for Other Services       Precautions / Restrictions Precautions Precautions: Fall;Back Precaution Comments: Pt able to recall 3/3 precautions with cues throughout tx to maintain.  Pt's daughter able to recall precautions.   Required Braces or Orthoses: Spinal Brace Spinal Brace: Lumbar corset;Applied in sitting position Restrictions Weight Bearing Restrictions: No    Mobility  Bed Mobility Overal bed mobility: Needs Assistance Bed Mobility: Rolling;Sidelying to Sit Rolling: Modified independent (Device/Increase time) Sidelying to sit: Modified independent (Device/Increase time)       General bed mobility comments: Good technique observed this am.    Transfers Overall transfer level: Needs assistance Equipment used: Rolling walker (2 wheeled) Transfers: Sit to/from Stand Sit to Stand: Supervision;Min assist         General transfer comment: Cues for hand placement to and from seated surface.  Pt intially reaches for RW.  [facility] and edge of bed.  Pt sat x2 on 2nd step in stair well and required min assist to ascend.    Ambulation/Gait Ambulation/Gait assistance: Supervision;Min  guard Ambulation Distance (Feet): 100 Feet (x2) Assistive device: Rolling walker (2 wheeled) Gait Pattern/deviations: Step-through pattern;Decreased stride length;Narrow base of support;Trunk flexed Gait velocity: decreased   General Gait Details: Cues for upper trunk control, avoid obstacles in halls and maintaining position of RW.  Pt tolerated tx well.  Fatigue observed after 1st trial.  Pt reports pain is greater than fatigue.     Stairs Stairs: Yes   Stair Management: One rail Left;One rail Right;Sideways;Step to pattern (L rail ascending and R rail descending) Number of Stairs: 4 General stair comments: Cues for sequencing and hand placement on rail.  Pt required seated rest break after negotiation of 4 stairs.    Wheelchair Mobility    Modified Rankin (Stroke Patients Only)       Balance Overall balance assessment: Needs assistance   Sitting balance-Leahy Scale: Good       Standing balance-Leahy Scale: Fair                      Cognition Arousal/Alertness: Awake/alert Behavior During Therapy: WFL for tasks assessed/performed Overall Cognitive Status: Within Functional Limits for tasks assessed                      Exercises      General Comments        Pertinent Vitals/Pain Pain Assessment: 0-10 Pain Score: 10-Worst pain ever Pain Location: lower abd Pain Descriptors / Indicators: Discomfort;Grimacing;Guarding Pain Intervention(s): Monitored during session;Patient requesting pain meds-RN notified;Repositioned    Home Living  Prior Function            PT Goals (current goals can now be found in the care plan section) Acute Rehab PT Goals Patient Stated Goal: decr pain Potential to Achieve Goals: Good Progress towards PT goals: Progressing toward goals    Frequency    Min 5X/week      PT Plan Current plan remains appropriate    Co-evaluation             End of Session Equipment  Utilized During Treatment: Back brace;Gait belt Activity Tolerance: Patient tolerated treatment well;Patient limited by fatigue Patient left: with call bell/phone within reach;with family/visitor present;in chair     Time: [id number] PT Time Calculation (min) (ACUTE ONLY): 34 min  Charges:  $Gait Training: 8-22 mins $Therapeutic Activity: 8-22 mins                    G Codes:      Florestine Aversimee J Harolyn Cocker 12/29/2015, 10:36 AM  Joycelyn RuaAimee Merrick Feutz, PTA pager [phone]

## 2017-03-11 IMAGING — RF DG C-ARM 61-120 MIN
1 series · 1 of 1 positions shown · non-contrast
Comparison: 12/25/2015

CLINICAL DATA: Lumbar fusion

EXAM:
LUMBAR SPINE - 1 VIEW; DG C-ARM 61-120 MIN

[Series 1: run · 1 of 1 slices shown]
[im 1/1]
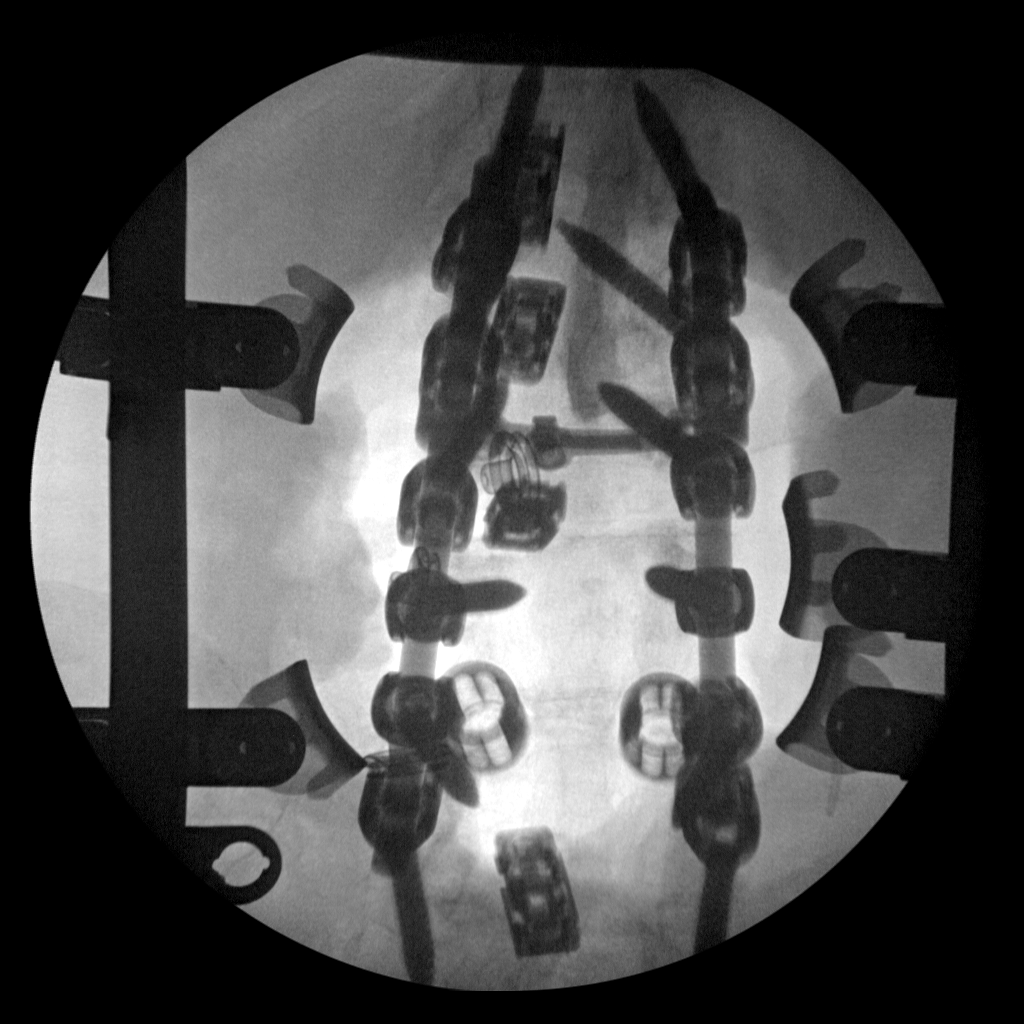

[1 of 1 positions shown; findings below may reference images not displayed]

FLUOROSCOPY TIME:  Radiation Exposure Index (as provided by the
fluoroscopic device): Not available

If the device does not provide the exposure index:

Fluoroscopy Time:  1 minutes 13 seconds

Number of Acquired Images:  1
FINDINGS: Pedicle screws with posterior fixation are seen extending from
L1-S1. Fusion prosthesis is the disc space at all levels. The L4-L5
fusion is stable from the prior study.
IMPRESSION: L1-2 S1 fusion.

## 2017-03-11 IMAGING — CR DG LUMBAR SPINE 2-3V
2 series · 2 of 2 positions shown · non-contrast
Comparison: CT, 11/16/2015

CLINICAL DATA: Fell localization imaging for lumbar spine surgery.

EXAM:
LUMBAR SPINE - 2-3 VIEW

[xtable lateral (1 of 2)]
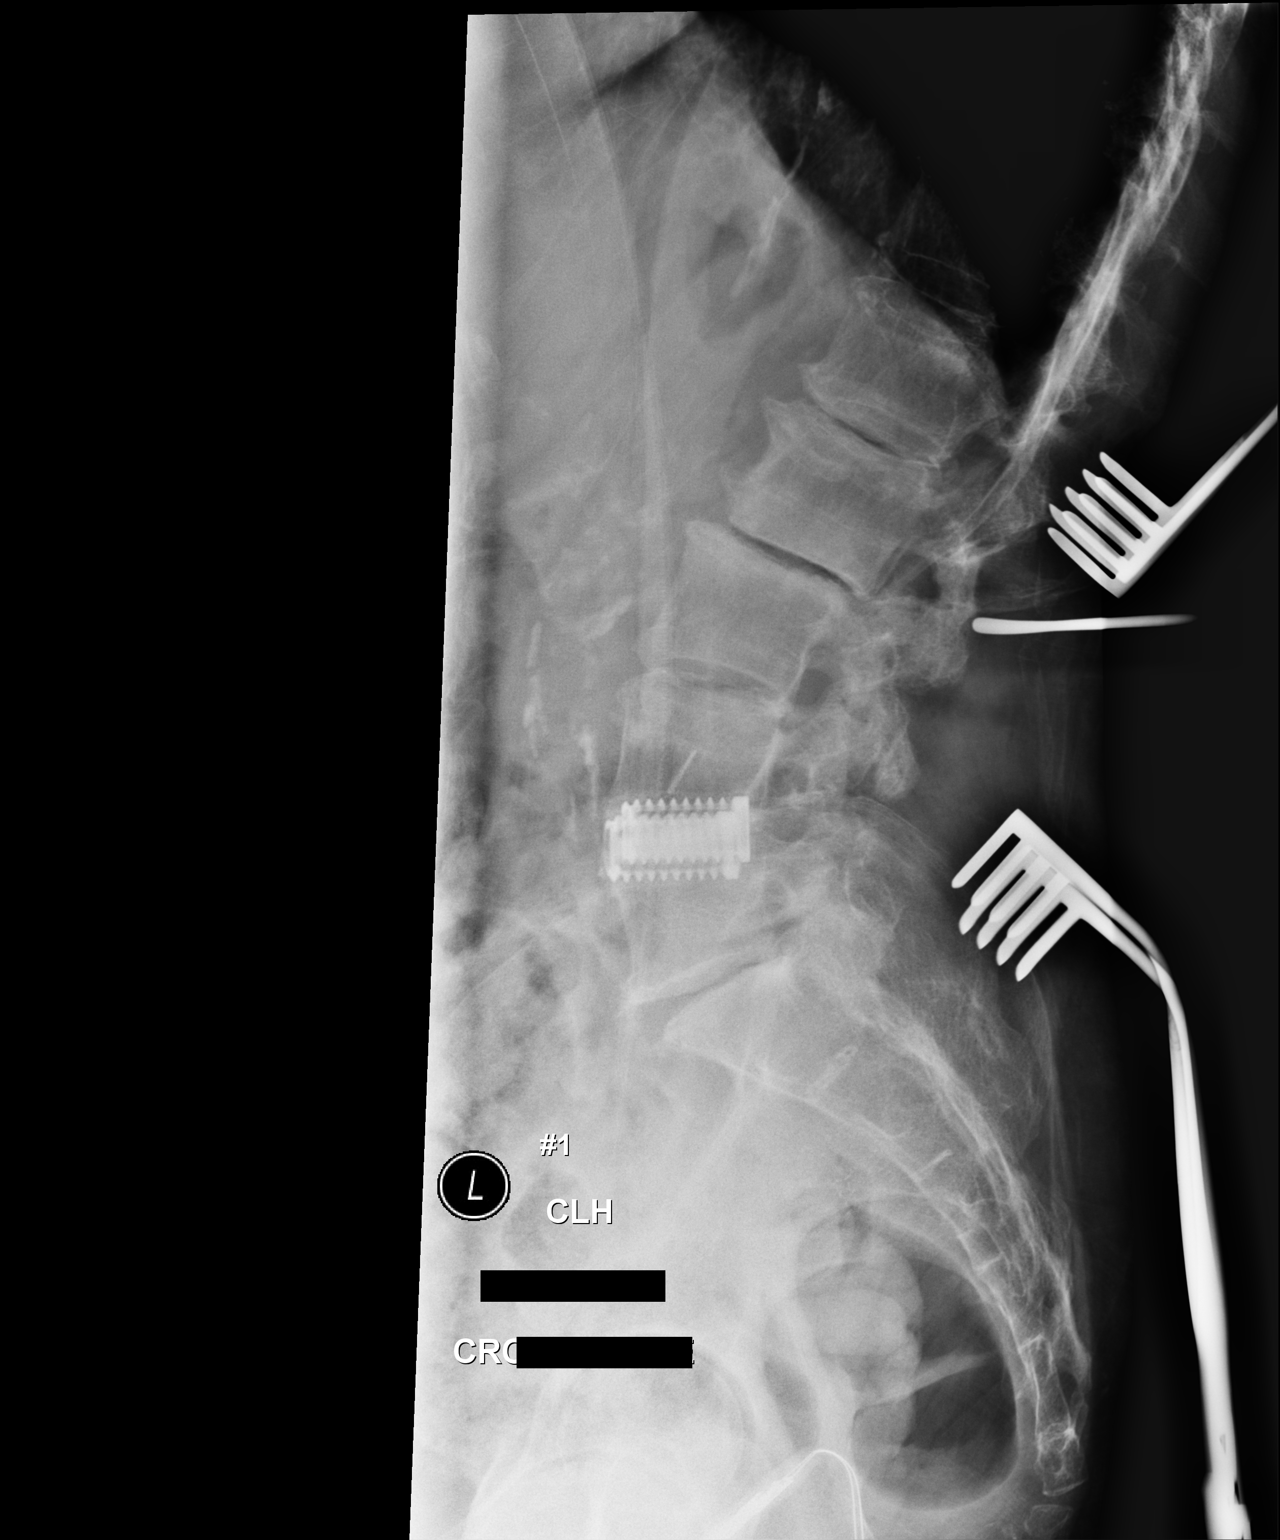

[xtable lateral (2 of 2)]
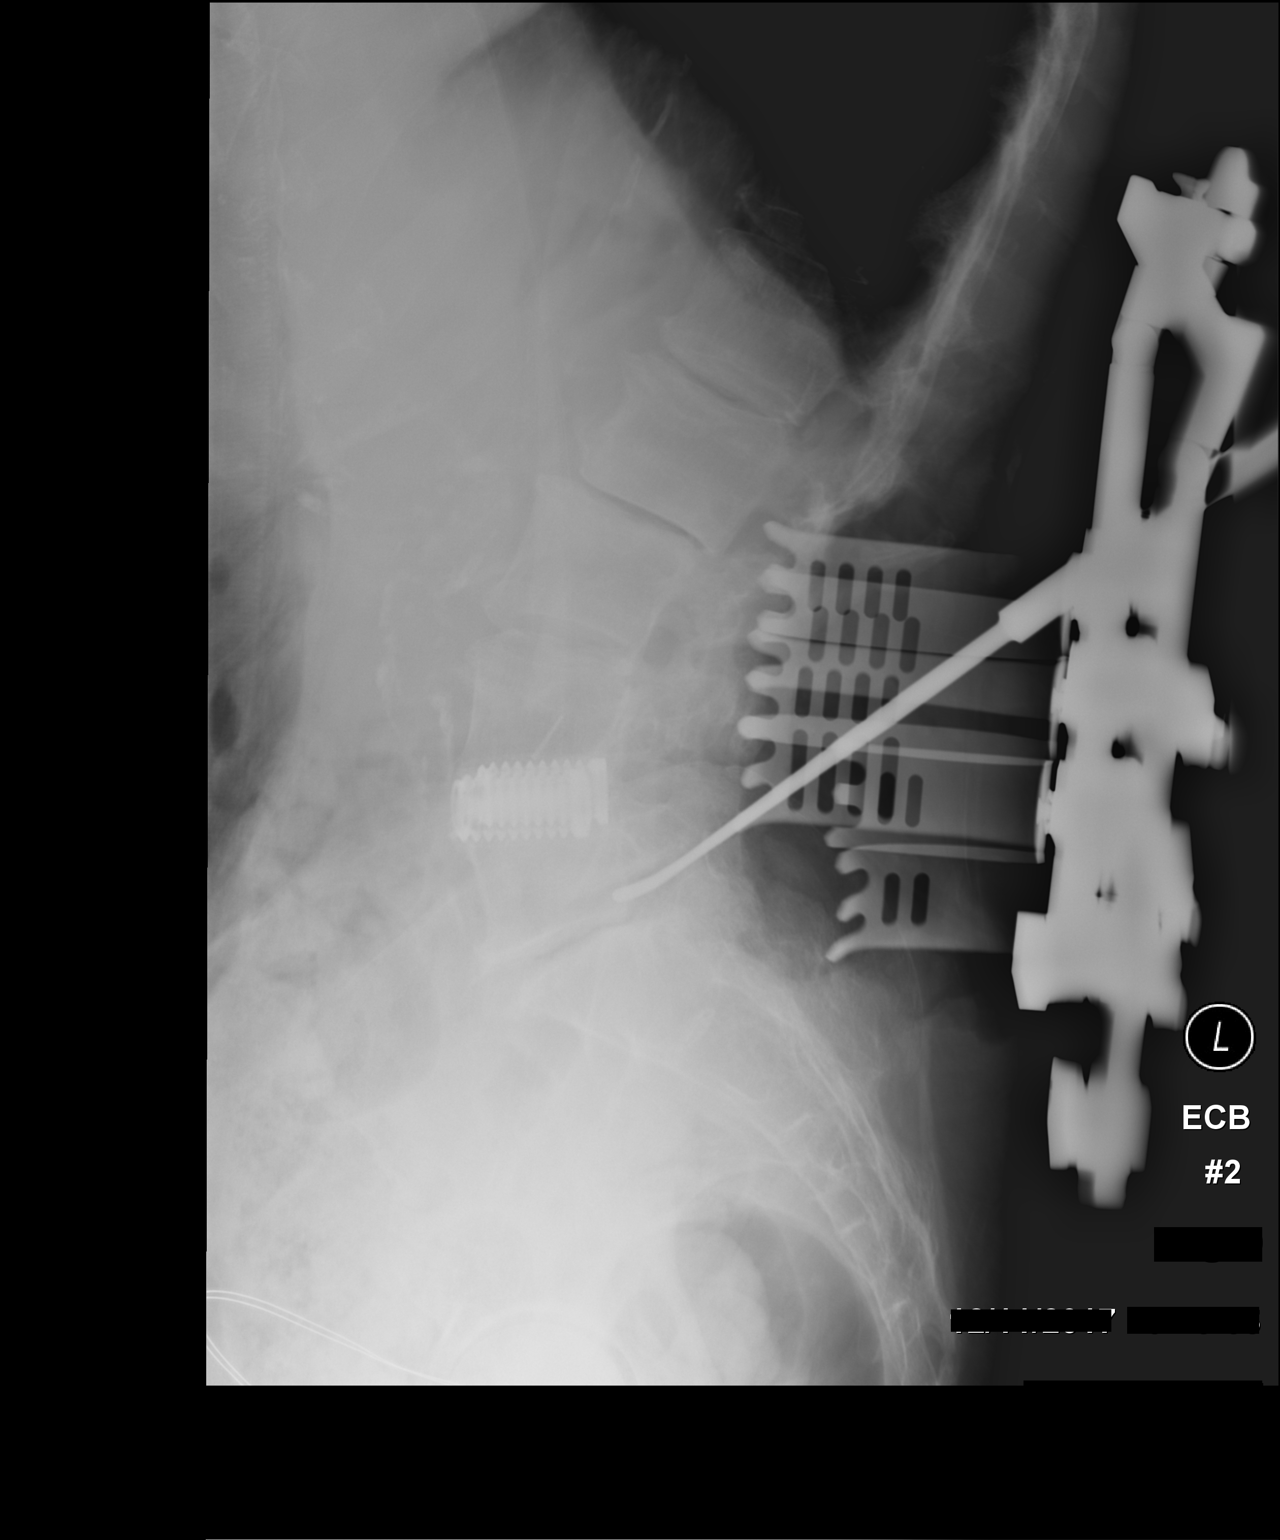

[2 of 2 positions shown; findings below may reference images not displayed]

FINDINGS: Initial portable lateral view shows a surgical probe with its tip
projecting 3.4 cm posterior to the posterior margin of the L2-L3
disc. Second image shows placement of a surgical probe with its tip
projecting over the posterior inferior corner of the L5 vertebra.
IMPRESSION: Surgical localization imaging as described.
